# Patient Record
Sex: Female | Born: 1970
Health system: Southern US, Community
[De-identification: ages and names within clinical notes are randomized; demographics above are authoritative.]

## PROBLEM LIST (undated history)

## (undated) DIAGNOSIS — K5090 Crohn's disease, unspecified, without complications: Secondary | ICD-10-CM

## (undated) DIAGNOSIS — D219 Benign neoplasm of connective and other soft tissue, unspecified: Secondary | ICD-10-CM

## (undated) HISTORY — PX: OTHER SURGICAL HISTORY: SHX169

## (undated) HISTORY — DX: Benign neoplasm of connective and other soft tissue, unspecified: D21.9

## (undated) HISTORY — PX: BLADDER SURGERY: SHX569

## (undated) HISTORY — DX: Crohn's disease, unspecified, without complications: K50.90

---

## 2001-07-28 ENCOUNTER — Other Ambulatory Visit: Admission: RE | Admit: 2001-07-28 | Discharge: 2001-07-28 | Payer: Self-pay | Admitting: Gynecology

## 2002-08-11 ENCOUNTER — Other Ambulatory Visit: Admission: RE | Admit: 2002-08-11 | Discharge: 2002-08-11 | Payer: Self-pay | Admitting: Gynecology

## 2003-08-16 ENCOUNTER — Other Ambulatory Visit: Admission: RE | Admit: 2003-08-16 | Discharge: 2003-08-16 | Payer: Self-pay | Admitting: Gynecology

## 2004-06-23 ENCOUNTER — Encounter: Admission: RE | Admit: 2004-06-23 | Discharge: 2004-06-23 | Payer: Self-pay | Admitting: Family Medicine

## 2004-07-04 ENCOUNTER — Encounter: Admission: RE | Admit: 2004-07-04 | Discharge: 2004-07-04 | Payer: Self-pay | Admitting: Gynecology

## 2004-08-21 ENCOUNTER — Other Ambulatory Visit: Admission: RE | Admit: 2004-08-21 | Discharge: 2004-08-21 | Payer: Self-pay | Admitting: Gynecology

## 2005-08-23 ENCOUNTER — Other Ambulatory Visit: Admission: RE | Admit: 2005-08-23 | Discharge: 2005-08-23 | Payer: Self-pay | Admitting: Gynecology

## 2006-08-27 ENCOUNTER — Other Ambulatory Visit: Admission: RE | Admit: 2006-08-27 | Discharge: 2006-08-27 | Payer: Self-pay | Admitting: Gynecology

## 2007-09-01 ENCOUNTER — Other Ambulatory Visit: Admission: RE | Admit: 2007-09-01 | Discharge: 2007-09-01 | Payer: Self-pay | Admitting: Gynecology

## 2009-08-09 ENCOUNTER — Encounter: Admission: RE | Admit: 2009-08-09 | Discharge: 2009-08-09 | Payer: Self-pay | Admitting: Gynecology

## 2010-08-10 ENCOUNTER — Encounter: Admission: RE | Admit: 2010-08-10 | Discharge: 2010-08-10 | Payer: Self-pay | Admitting: Gynecology

## 2011-07-10 ENCOUNTER — Other Ambulatory Visit: Payer: Self-pay | Admitting: Gynecology

## 2011-07-10 DIAGNOSIS — Z1231 Encounter for screening mammogram for malignant neoplasm of breast: Secondary | ICD-10-CM

## 2011-08-13 ENCOUNTER — Ambulatory Visit
Admission: RE | Admit: 2011-08-13 | Discharge: 2011-08-13 | Disposition: A | Discharge status: Home / Self Care | Payer: BC Managed Care – PPO | Source: Ambulatory Visit | Attending: Gynecology | Admitting: Gynecology

## 2011-08-13 DIAGNOSIS — Z1231 Encounter for screening mammogram for malignant neoplasm of breast: Secondary | ICD-10-CM

## 2012-07-17 ENCOUNTER — Other Ambulatory Visit: Payer: Self-pay | Admitting: Gynecology

## 2012-07-17 DIAGNOSIS — Z1231 Encounter for screening mammogram for malignant neoplasm of breast: Secondary | ICD-10-CM

## 2012-08-13 ENCOUNTER — Ambulatory Visit
Admission: RE | Admit: 2012-08-13 | Discharge: 2012-08-13 | Disposition: A | Discharge status: Home / Self Care | Payer: BC Managed Care – PPO | Source: Ambulatory Visit | Attending: Gynecology | Admitting: Gynecology

## 2012-08-13 DIAGNOSIS — Z1231 Encounter for screening mammogram for malignant neoplasm of breast: Secondary | ICD-10-CM

## 2013-09-11 ENCOUNTER — Encounter (HOSPITAL_COMMUNITY): Payer: Self-pay | Admitting: Emergency Medicine

## 2013-09-11 ENCOUNTER — Emergency Department (HOSPITAL_COMMUNITY): Payer: BC Managed Care – PPO

## 2013-09-11 ENCOUNTER — Emergency Department (HOSPITAL_COMMUNITY)
Admission: EM | Admit: 2013-09-11 | Discharge: 2013-09-11 | Disposition: A | Discharge status: Home / Self Care | Payer: BC Managed Care – PPO | Attending: Emergency Medicine | Admitting: Emergency Medicine

## 2013-09-11 DIAGNOSIS — M79672 Pain in left foot: Secondary | ICD-10-CM

## 2013-09-11 DIAGNOSIS — M79609 Pain in unspecified limb: Secondary | ICD-10-CM | POA: Insufficient documentation

## 2013-09-11 MED ORDER — IBUPROFEN 800 MG PO TABS: 800.0000 mg | ORAL_TABLET | Freq: Three times a day (TID) | ORAL | Status: DC | PRN

## 2013-09-11 NOTE — ED Provider Notes (Signed)
CSN: [id number]     Arrival date & time 09/11/13  2458 History   First MD Initiated Contact with Patient 09/11/13 0848     Chief Complaint  Patient presents with  . Foot Pain   (Consider location/radiation/quality/duration/timing/severity/associated sxs/prior Treatment) The history is provided by the patient.   Patient p/w left dorsal foot pain that began several days ago after walking around the parking lot in flip flops.  Pt states she usually walks a lot but not in flip flops.  She is from [place] and is concerned she may have a stress fracture, wanted to get checked before she goes back home.  Pain is intermittent, exacerbated by walking and palpation.  Denies fevers, chills, leg swelling, CP, SOB.  Denies any trauma to the foot or any bone abnormalities.    History reviewed. No pertinent past medical history. Past Surgical History  Procedure Laterality Date  . Bladder surgery     Family History  Problem Relation Age of Onset  . Diabetes Mother   . Hypertension Mother   . Cancer Father   . Multiple sclerosis Father   . Hypertension Father    History  Substance Use Topics  . Smoking status: Never Smoker   . Smokeless tobacco: Never Used  . Alcohol Use: Yes     Comment: wine 2 glasses/week   OB History   Grav Para Term Preterm Abortions TAB SAB Ect Mult Living                 Review of Systems  Constitutional: Negative for fever and chills.  Respiratory: Negative for shortness of breath.   Cardiovascular: Negative for chest pain and leg swelling.  Skin: Negative for color change, pallor and wound.  Neurological: Negative for weakness and numbness.    Allergies  Review of patient's allergies indicates not on file.  Home Medications  No current outpatient prescriptions on file. BP 126/83  Pulse 97  Temp(Src) 98.3 F (36.8 C) (Oral)  Resp 16  Ht 5' 5.5" (1.664 m)  Wt 138 lb (62.596 kg)  BMI 22.61 kg/m2  SpO2 98%  LMP 09/11/2013 Physical Exam  Nursing note  and vitals reviewed. Constitutional: She appears well-developed and well-nourished. No distress.  HENT:  Head: Normocephalic and atraumatic.  Neck: Neck supple.  Pulmonary/Chest: Effort normal.  Musculoskeletal:       Feet:  Left foot:  Full AROM of all toes and of left ankle.  Capillary refill < 2 seconds, sensation intact.  No skin lesions or discoloration.    Neurological: She is alert.  Skin: She is not diaphoretic.    ED Course  Procedures (including critical care time) Labs Review Labs Reviewed - No data to display Imaging Review Dg Foot Complete Left  09/11/2013   CLINICAL DATA:  Left foot pain.  EXAM: LEFT FOOT - COMPLETE 3+ VIEW  COMPARISON:  None.  FINDINGS: There is no evidence of fracture or dislocation. There is no evidence of arthropathy or other focal bone abnormality. Soft tissues are unremarkable.  IMPRESSION: Negative.   Electronically Signed   By: Dereck Ligas M.D.   On: 09/11/2013 09:39    EKG Interpretation   None       MDM   1. Left foot pain      Pt with injury to left foot after walking for a prolonged period on asphalt in flip flops.  Likely strained/sprained.  Pt concerned about stress fracture.  Xray negative.  Discussed result, findings, treatment, and follow up  with patient.  Pt given return precautions.  Pt verbalizes understanding and agrees with plan.        [place], PA-C 09/11/13 [phone]

## 2013-09-11 NOTE — ED Notes (Signed)
Pt ambulated back to room from xray.

## 2013-09-11 NOTE — ED Notes (Signed)
Patient reports that she began having left foot pain after walking on Tuesday. Patient normally walks an hour daily. Left foot slightly bruised and patient states she had slight swelling that occurs at night.

## 2013-09-12 NOTE — ED Provider Notes (Signed)
Medical screening examination/treatment/procedure(s) were performed by non-physician practitioner and as supervising physician I was immediately available for consultation/collaboration.   Houston Siren, MD 09/12/13 [phone]

## 2014-02-12 IMAGING — CR DG FOOT COMPLETE 3+V*L*
3 series · 3 of 3 positions shown · non-contrast
Comparison: None.

CLINICAL DATA: Left foot pain.

EXAM:
LEFT FOOT - COMPLETE 3+ VIEW

[x foot ap left]
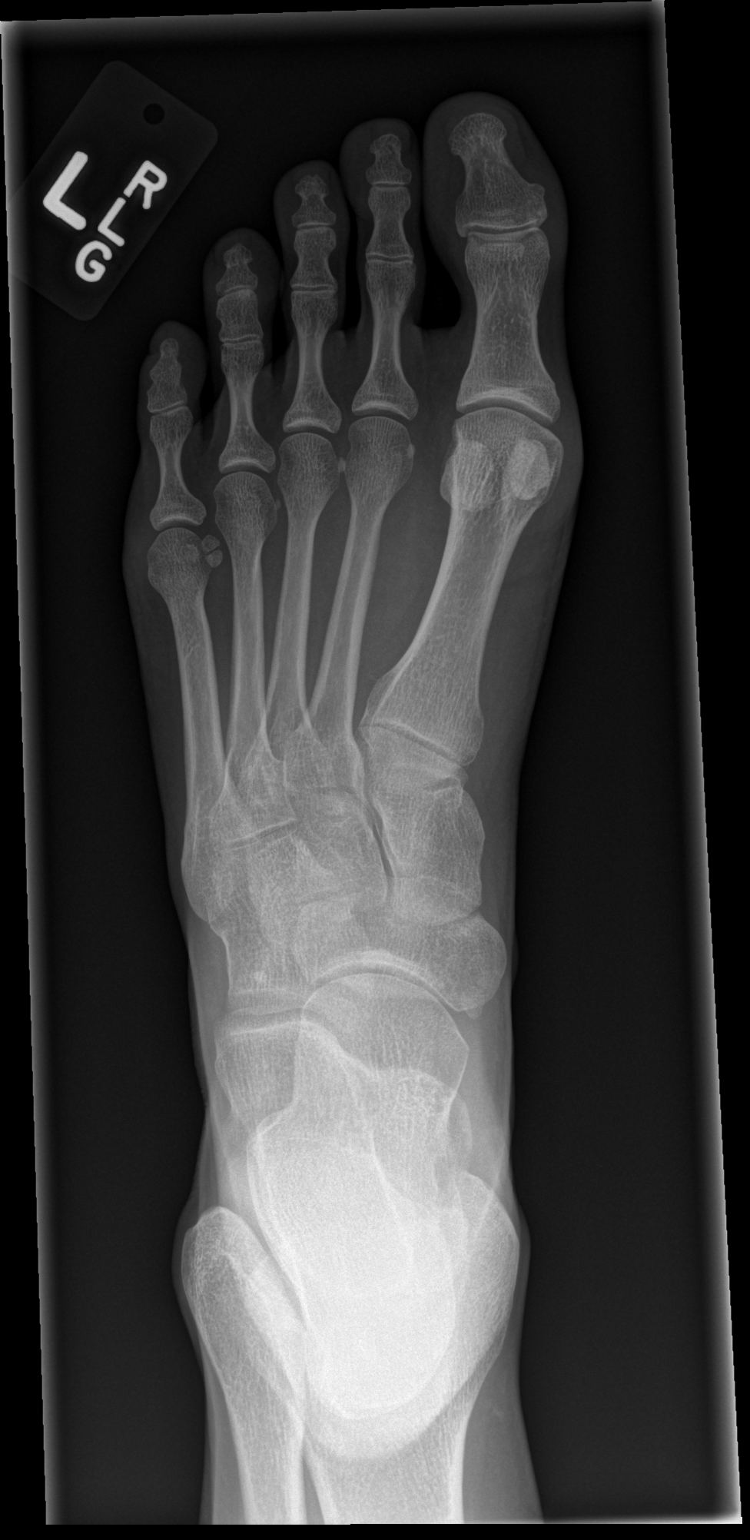

[x foot obl left]
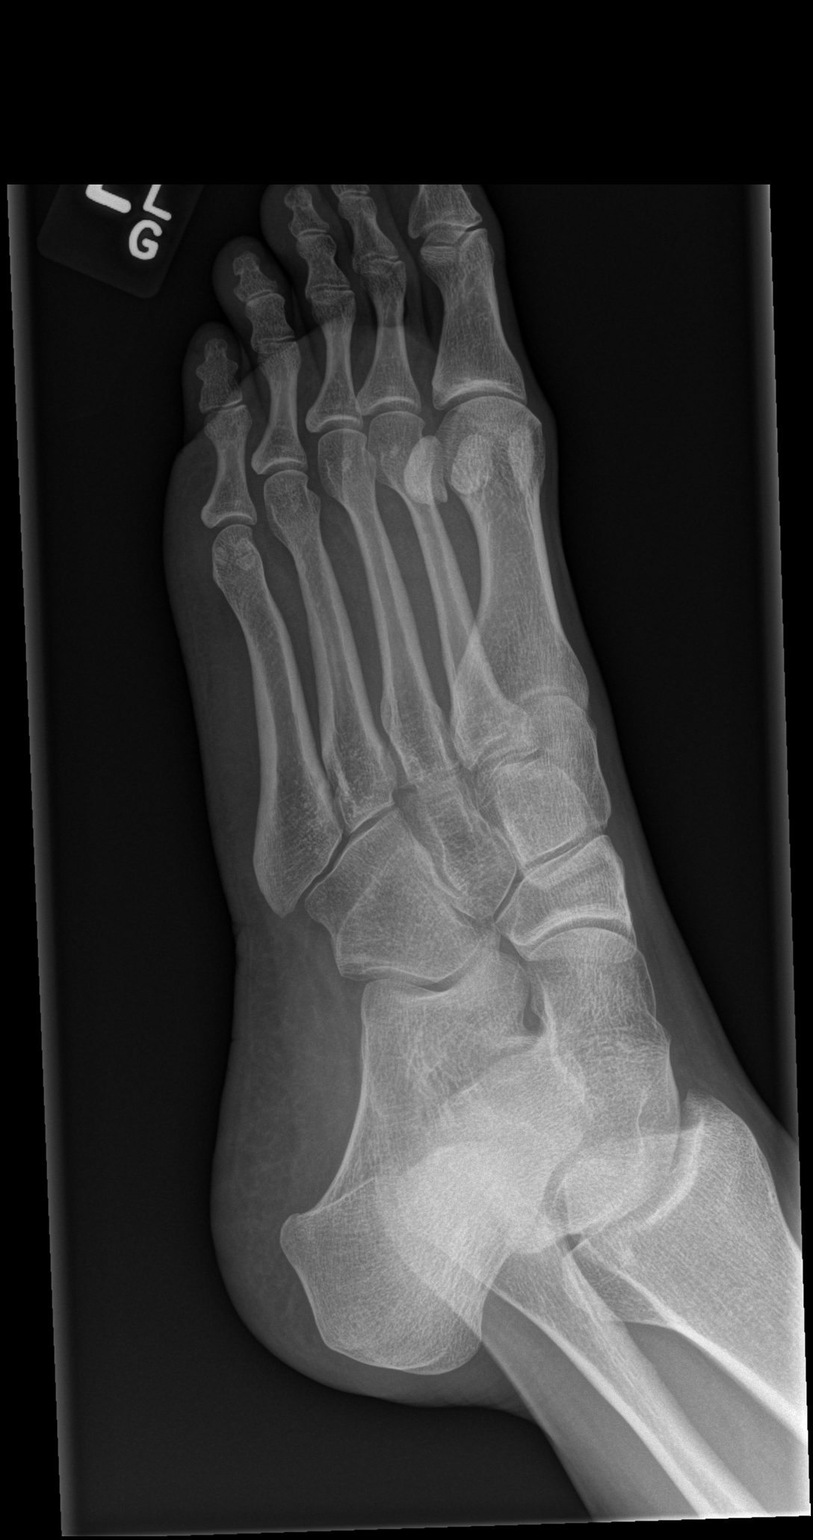

[x foot lat left]
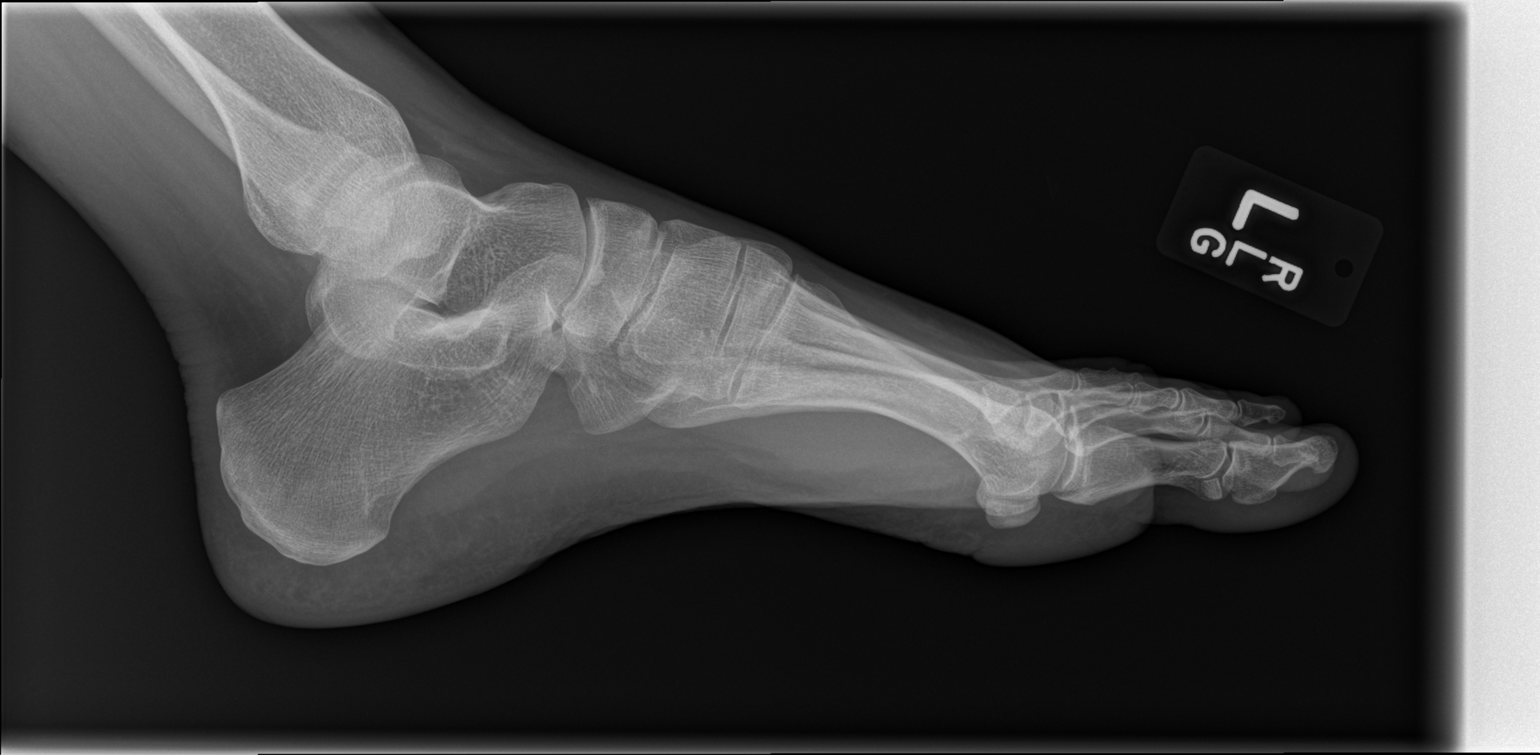

[3 of 3 positions shown; findings below may reference images not displayed]

FINDINGS: There is no evidence of fracture or dislocation. There is no
evidence of arthropathy or other focal bone abnormality. Soft
tissues are unremarkable.
IMPRESSION: Negative.

## 2019-12-04 DIAGNOSIS — Z1211 Encounter for screening for malignant neoplasm of colon: Secondary | ICD-10-CM

## 2019-12-04 HISTORY — DX: Encounter for screening for malignant neoplasm of colon: Z12.11

## 2019-12-08 ENCOUNTER — Other Ambulatory Visit: Payer: Self-pay

## 2019-12-08 ENCOUNTER — Encounter: Payer: Self-pay | Admitting: Obstetrics and Gynecology

## 2019-12-08 ENCOUNTER — Ambulatory Visit (INDEPENDENT_AMBULATORY_CARE_PROVIDER_SITE_OTHER): Payer: BC Managed Care – PPO | Admitting: Obstetrics and Gynecology

## 2019-12-08 VITALS — BP 130/80 | Ht 65.5 in | Wt 147.0 lb

## 2019-12-08 DIAGNOSIS — Z3041 Encounter for surveillance of contraceptive pills: Secondary | ICD-10-CM

## 2019-12-08 DIAGNOSIS — Z13220 Encounter for screening for lipoid disorders: Secondary | ICD-10-CM

## 2019-12-08 DIAGNOSIS — Z0000 Encounter for general adult medical examination without abnormal findings: Secondary | ICD-10-CM

## 2019-12-08 DIAGNOSIS — Z01419 Encounter for gynecological examination (general) (routine) without abnormal findings: Secondary | ICD-10-CM | POA: Diagnosis not present

## 2019-12-08 DIAGNOSIS — Z1211 Encounter for screening for malignant neoplasm of colon: Secondary | ICD-10-CM

## 2019-12-08 DIAGNOSIS — Z1231 Encounter for screening mammogram for malignant neoplasm of breast: Secondary | ICD-10-CM

## 2019-12-08 MED ORDER — NORETHIN ACE-ETH ESTRAD-FE 1-20 MG-MCG PO TABS: 1.0000 | ORAL_TABLET | Freq: Every day | ORAL | 3 refills | Status: DC

## 2019-12-08 NOTE — Patient Instructions (Signed)
I value your feedback and entrusting us with your care. If you get a [facility] patient survey, I would appreciate you taking the time to let us know about your experience today. Thank you!  As of November 12, 2019, your lab results will be released to your MyChart immediately, before I even have a chance to see them. Please give me time to review them and contact you if there are any abnormalities. Thank you for your patience.   [facility]: [phone]  [facility]: [phone]  

## 2019-12-08 NOTE — Progress Notes (Addendum)
PCP: Patient, No Pcp Per   Chief Complaint  Patient presents with  . Gynecologic Exam    HPI:      [name] is a 49 y.o. No obstetric history on file. who LMP was No LMP recorded. (Menstrual status: Oral contraceptives)., presents today for her NP annual examination.  Her menses are regular every 28-30 days, lasting 2 days, spotting only, on OCPs for hx of fibroid.  Dysmenorrhea none. She does not have intermenstrual bleeding. She does not have vasomotor sx.   Sex activity: single partner, contraception - vasectomy. She does have vaginal dryness, relieved with lubricants.  Last Pap: November 03, 2018  Results were: no abnormalities /neg HPV DNA. No hx of abn paps.  Last mammogram: December 05, 2018  Results were: normal--routine follow-up in 12 months There is no FH of breast cancer. There is no FH of ovarian cancer. The patient does do self-breast exams.  Colonoscopy: never, would like cologuard.    Tobacco use: The patient denies current or previous tobacco use. Alcohol use: glass of wine with dinner  No drug use Exercise: moderately active  She does get adequate calcium and Vitamin D in her diet.  No recent labs.  Past Medical History:  Diagnosis Date  . Crohn disease ([place])    59 yr  . Fibroid     Past Surgical History:  Procedure Laterality Date  . BLADDER SURGERY      Family History  Problem Relation Age of Onset  . Hypertension Mother   . Multiple sclerosis Father   . Hypertension Father   . Lung cancer Father   . Breast cancer Other   . Cervical cancer Maternal Aunt        40s  . Ovarian cancer Neg Hx     Social History   Socioeconomic History  . Marital status: Married    Spouse name: Not on file  . Number of children: Not on file  . Years of education: Not on file  . Highest education level: Not on file  Occupational History  . Not on file  Tobacco Use  . Smoking status: Never Smoker  . Smokeless tobacco: Never Used  Substance  and Sexual Activity  . Alcohol use: Yes    Comment: wine 2 glasses/week  . Drug use: No  . Sexual activity: Yes    Birth control/protection: Pill, Surgical    Comment: Vasectomy  Other Topics Concern  . Not on file  Social History Narrative  . Not on file   Social Determinants of Health   Financial Resource Strain:   . Difficulty of Paying Living Expenses: Not on file  Food Insecurity:   . Worried About Charity fundraiser in the Last Year: Not on file  . Ran Out of Food in the Last Year: Not on file  Transportation Needs:   . Lack of Transportation (Medical): Not on file  . Lack of Transportation (Non-Medical): Not on file  Physical Activity:   . Days of Exercise per Week: Not on file  . Minutes of Exercise per Session: Not on file  Stress:   . Feeling of Stress : Not on file  Social Connections:   . Frequency of Communication with Friends and Family: Not on file  . Frequency of Social Gatherings with Friends and Family: Not on file  . Attends Religious Services: Not on file  . Active Member of Clubs or Organizations: Not on file  . Attends Archivist Meetings:  Not on file  . Marital Status: Not on file  Intimate Partner Violence:   . Fear of Current or Ex-Partner: Not on file  . Emotionally Abused: Not on file  . Physically Abused: Not on file  . Sexually Abused: Not on file     Current Outpatient Medications:  .  cholecalciferol (VITAMIN D) 1000 UNITS tablet, Take 1,000 Units by mouth daily., Disp: , Rfl:  .  norethindrone-ethinyl estradiol (BLISOVI FE 1/20) 1-20 MG-MCG tablet, Take 1 tablet by mouth daily., Disp: 3 Package, Rfl: 3 .  vitamin B-12 (CYANOCOBALAMIN) 1000 MCG tablet, Take 1,000 mcg by mouth daily., Disp: , Rfl:      ROS:  Review of Systems  Constitutional: Negative for fatigue, fever and unexpected weight change.  Respiratory: Negative for cough, shortness of breath and wheezing.   Cardiovascular: Negative for chest pain, palpitations  and leg swelling.  Gastrointestinal: Negative for blood in stool, constipation, diarrhea, nausea and vomiting.  Endocrine: Negative for cold intolerance, heat intolerance and polyuria.  Genitourinary: Negative for dyspareunia, dysuria, flank pain, frequency, genital sores, hematuria, menstrual problem, pelvic pain, urgency, vaginal bleeding, vaginal discharge and vaginal pain.  Musculoskeletal: Negative for back pain, joint swelling and myalgias.  Skin: Negative for rash.  Neurological: Negative for dizziness, syncope, light-headedness, numbness and headaches.  Hematological: Negative for adenopathy.  Psychiatric/Behavioral: Negative for agitation, confusion, sleep disturbance and suicidal ideas. The patient is not nervous/anxious.    BREAST: No symptoms    Objective: BP 130/80   Ht 5' 5.5" (1.664 m)   Wt 147 lb (66.7 kg)   BMI 24.09 kg/m    Physical Exam Constitutional:      Appearance: She is well-developed.  Genitourinary:     Vulva, vagina, cervix, uterus, right adnexa and left adnexa normal.     No vulval lesion or tenderness noted.     No vaginal discharge, erythema or tenderness.     No cervical polyp.     Uterus is not enlarged or tender.     No right or left adnexal mass present.     Right adnexa not tender.     Left adnexa not tender.  Neck:     Thyroid: No thyromegaly.  Cardiovascular:     Rate and Rhythm: Normal rate and regular rhythm.     Heart sounds: Normal heart sounds. No murmur.  Pulmonary:     Effort: Pulmonary effort is normal.     Breath sounds: Normal breath sounds.  Chest:     Breasts:        Right: No mass, nipple discharge, skin change or tenderness.        Left: No mass, nipple discharge, skin change or tenderness.  Abdominal:     Palpations: Abdomen is soft.     Tenderness: There is no abdominal tenderness. There is no guarding.  Musculoskeletal:        General: Normal range of motion.     Cervical back: Normal range of motion.   Neurological:     General: No focal deficit present.     Mental Status: She is alert and oriented to person, place, and time.     Cranial Nerves: No cranial nerve deficit.  Skin:    General: Skin is warm and dry.  Psychiatric:        Mood and Affect: Mood normal.        Behavior: Behavior normal.        Thought Content: Thought content normal.  Judgment: Judgment normal.  Vitals reviewed.     Assessment/Plan:  Encounter for annual routine gynecological examination  Encounter for surveillance of contraceptive pills - Plan: norethindrone-ethinyl estradiol (BLISOVI FE 1/20) 1-20 MG-MCG tablet; Discussed stopping OCPs vs taking 1 another yr. Pt would like to continue for this yr. Rx RF. Re-eval next yr.  Encounter for screening mammogram for malignant neoplasm of breast - Plan: 3D MAMMOGRAM SCREENING BILATERAL; pt to sched mammo  Screening for colon cancer - Plan: Cologuard; ref sent  Blood tests for routine general physical examination - Plan: Comprehensive metabolic panel, Lipid panel  Screening cholesterol level - Plan: Lipid panel   Meds ordered this encounter  Medications  . norethindrone-ethinyl estradiol (BLISOVI FE 1/20) 1-20 MG-MCG tablet    Sig: Take 1 tablet by mouth daily.    Dispense:  3 Package    Refill:  3    Order Specific Question:   Supervising Provider    Answer:   Gae Dry [[id number]]            GYN counsel breast self exam, mammography screening, menopause, adequate intake of calcium and vitamin D, diet and exercise    F/U  Return in about 1 year (around 12/07/2020).  Leyanna Bittman B. Marisha Renier, PA-C 12/08/2019 8:46 AM

## 2019-12-09 LAB — COMPREHENSIVE METABOLIC PANEL
ALT: 13 IU/L (ref 0–32)
AST: 18 IU/L (ref 0–40)
Albumin/Globulin Ratio: 2 (ref 1.2–2.2)
Albumin: 4.1 g/dL (ref 3.8–4.8)
Alkaline Phosphatase: 48 IU/L (ref 39–117)
BUN/Creatinine Ratio: 15 (ref 9–23)
BUN: 12 mg/dL (ref 6–24)
Bilirubin Total: 0.3 mg/dL (ref 0.0–1.2)
CO2: 21 mmol/L (ref 20–29)
Calcium: 9.2 mg/dL (ref 8.7–10.2)
Chloride: 104 mmol/L (ref 96–106)
Creatinine, Ser: 0.82 mg/dL (ref 0.57–1.00)
GFR calc Af Amer: 96 mL/min/{1.73_m2} (ref >59)
GFR calc non Af Amer: 84 mL/min/{1.73_m2} (ref >59)
Globulin, Total: 2.1 g/dL (ref 1.5–4.5)
Glucose: 75 mg/dL (ref 65–99)
Potassium: 4.1 mmol/L (ref 3.5–5.2)
Sodium: 139 mmol/L (ref 134–144)
Total Protein: 6.2 g/dL (ref 6.0–8.5)

## 2019-12-09 LAB — LIPID PANEL
Chol/HDL Ratio: 3.9 ratio (ref 0.0–4.4)
Cholesterol, Total: 214 mg/dL — ABNORMAL HIGH (ref 100–199)
HDL: 55 mg/dL (ref >39)
LDL Chol Calc (NIH): 127 mg/dL — ABNORMAL HIGH (ref 0–99)
Triglycerides: 183 mg/dL — ABNORMAL HIGH (ref 0–149)
VLDL Cholesterol Cal: 32 mg/dL (ref 5–40)

## 2019-12-10 NOTE — Progress Notes (Signed)
Pls give pt lab results. Her lipids are borderline. Need to decrease starchy carbs like bread/potatoes/crackers/chips/desserts, and increase fresh fruits/vegetables, lean meats. Also minimize saturated fats in fried foods, red meats, whole fat dairy products. Recheck in 1 yr.  Thx.

## 2019-12-14 ENCOUNTER — Telehealth: Payer: Self-pay | Admitting: Obstetrics and Gynecology

## 2019-12-14 NOTE — Telephone Encounter (Signed)
ERROR: Did not try to call pt yet to record "UTR, VM not set up".  Msg sent to CMA to call pt with lab results, info documented in results. Thx.

## 2019-12-14 NOTE — Telephone Encounter (Signed)
UTR, VM not set up

## 2019-12-14 NOTE — Telephone Encounter (Signed)
Patient is calling for labs results. Please advise. 

## 2019-12-15 NOTE — Telephone Encounter (Signed)
Pt aware.

## 2019-12-23 LAB — COLOGUARD

## 2019-12-24 DIAGNOSIS — C4491 Basal cell carcinoma of skin, unspecified: Secondary | ICD-10-CM

## 2019-12-24 DIAGNOSIS — C4492 Squamous cell carcinoma of skin, unspecified: Secondary | ICD-10-CM

## 2019-12-24 HISTORY — DX: Basal cell carcinoma of skin, unspecified: C44.91

## 2019-12-24 HISTORY — DX: Squamous cell carcinoma of skin, unspecified: C44.92

## 2019-12-29 ENCOUNTER — Encounter: Payer: Self-pay | Admitting: Obstetrics and Gynecology

## 2019-12-29 ENCOUNTER — Telehealth: Payer: Self-pay

## 2019-12-29 NOTE — Telephone Encounter (Signed)
NEGATIVE Cologuard Test, good for the next 3 yrs, pt aware.

## 2020-02-18 ENCOUNTER — Telehealth: Payer: Self-pay | Admitting: Obstetrics and Gynecology

## 2020-02-18 NOTE — Telephone Encounter (Signed)
Pt had f/u mammo and u/s RT breast at [facility] yesterday for abn screening mammo from 1/21. Birads 3 due to dense tissue. Recommended repeat mammo in 6 months. Pt plans to sched. F/u prn.

## 2020-07-07 ENCOUNTER — Encounter: Payer: Self-pay | Admitting: Dermatology

## 2020-08-17 ENCOUNTER — Encounter: Payer: Self-pay | Admitting: Obstetrics and Gynecology

## 2020-09-29 ENCOUNTER — Ambulatory Visit (INDEPENDENT_AMBULATORY_CARE_PROVIDER_SITE_OTHER): Payer: BC Managed Care – PPO | Admitting: Dermatology

## 2020-09-29 ENCOUNTER — Other Ambulatory Visit: Payer: Self-pay

## 2020-09-29 ENCOUNTER — Encounter: Payer: Self-pay | Admitting: Dermatology

## 2020-09-29 DIAGNOSIS — L821 Other seborrheic keratosis: Secondary | ICD-10-CM

## 2020-09-29 DIAGNOSIS — D229 Melanocytic nevi, unspecified: Secondary | ICD-10-CM | POA: Diagnosis not present

## 2020-09-29 DIAGNOSIS — L814 Other melanin hyperpigmentation: Secondary | ICD-10-CM

## 2020-09-29 DIAGNOSIS — Z85828 Personal history of other malignant neoplasm of skin: Secondary | ICD-10-CM

## 2020-09-29 DIAGNOSIS — Z1283 Encounter for screening for malignant neoplasm of skin: Secondary | ICD-10-CM | POA: Diagnosis not present

## 2020-09-29 DIAGNOSIS — D1800 Hemangioma unspecified site: Secondary | ICD-10-CM

## 2020-09-29 DIAGNOSIS — L578 Other skin changes due to chronic exposure to nonionizing radiation: Secondary | ICD-10-CM

## 2020-09-29 NOTE — Progress Notes (Signed)
° °  Follow-Up Visit   Subjective  Jane Wells is a 49 y.o. female who presents for the following: FBSE (Patient here today for full body scan. She is not aware of anything new or changing. She does have a history of BCC and SCCis. ).  Patient here for full body skin exam and skin cancer screening.  The following portions of the chart were reviewed this encounter and updated as appropriate:  Tobacco   Allergies   Meds   Problems   Med Hx   Surg Hx   Fam Hx       Review of Systems:  No other skin or systemic complaints except as noted in HPI or Assessment and Plan.  Objective  Well appearing patient in no apparent distress; mood and affect are within normal limits.  A full examination was performed including scalp, head, eyes, ears, nose, lips, neck, chest, axillae, abdomen, back, buttocks, bilateral upper extremities, bilateral lower extremities, hands, feet, fingers, toes, fingernails, and toenails. All findings within normal limits unless otherwise noted below.    Assessment & Plan    Lentigines - Scattered tan macules - Discussed due to sun exposure - Benign, observe - Call for any changes  Seborrheic Keratoses - Stuck-on, waxy, tan-brown papules and plaques  - Discussed benign etiology and prognosis. - Observe - Call for any changes  Melanocytic Nevi - Tan-brown and/or pink-flesh-colored symmetric macules and papules - Benign appearing on exam today - Observation - Call clinic for new or changing moles - Recommend daily use of broad spectrum spf 30+ sunscreen to sun-exposed areas.   Hemangiomas - Red papules - Discussed benign nature - Observe - Call for any changes  Actinic Damage - diffuse scaly erythematous macules with underlying dyspigmentation - Recommend daily broad spectrum sunscreen SPF 30+ to sun-exposed areas, reapply every 2 hours as needed.  - Call for new or changing lesions.  Skin cancer screening performed today.  History of Basal Cell  Carcinoma of the Skin - No evidence of recurrence today at left calf - Recommend regular full body skin exams - Recommend daily broad spectrum sunscreen SPF 30+ to sun-exposed areas, reapply every 2 hours as needed.  - Call if any new or changing lesions are noted between office visits  History of Squamous Cell Carcinoma in Situ of the Skin - No evidence of recurrence today at left distal shin - Recommend regular full body skin exams - Recommend daily broad spectrum sunscreen SPF 30+ to sun-exposed areas, reapply every 2 hours as needed.  - Call if any new or changing lesions are noted between office visits  Return in about 6 months (around 03/30/2021) for TBSE.  Graciella Belton, RMA, am acting as scribe for Forest Gleason, MD .  Documentation: I have reviewed the above documentation for accuracy and completeness, and I agree with the above.  Forest Gleason, MD

## 2020-09-29 NOTE — Patient Instructions (Signed)

## 2020-11-16 ENCOUNTER — Other Ambulatory Visit: Payer: Self-pay | Admitting: Obstetrics and Gynecology

## 2020-11-16 DIAGNOSIS — Z3041 Encounter for surveillance of contraceptive pills: Secondary | ICD-10-CM

## 2020-12-13 ENCOUNTER — Ambulatory Visit: Payer: BC Managed Care – PPO | Admitting: Obstetrics and Gynecology

## 2020-12-28 ENCOUNTER — Encounter: Payer: Self-pay | Admitting: Dermatology

## 2021-01-18 NOTE — Patient Instructions (Addendum)
I value your feedback and you entrusting us with your care. If you get a [facility] patient survey, I would appreciate you taking the time to let us know about your experience today. Thank you!   [facility]: [phone]  

## 2021-01-18 NOTE — Progress Notes (Signed)
PCP: Patient, No Pcp Per   Chief Complaint  Patient presents with  . Gynecologic Exam    No concerns    HPI:      [name] is a 50 y.o. No obstetric history on file. who LMP was Patient's last menstrual period was 01/09/2021 (exact date)., presents today for her annual examination.  Her menses are regular every 28-30 days, lasting 4 days, spotting only, on OCPs for hx of fibroid.  Dysmenorrhea mild. She does not have intermenstrual bleeding. Didn't have bleeding in past with sprintec per pt but does with junel 1/20 (not sure when change done since I first saw pt 1/21). May want to change back to previous pills.   Sex activity: single partner, contraception - vasectomy. She does have vaginal dryness, relieved with lubricants.  Last Pap: November 03, 2018  Results were: no abnormalities /neg HPV DNA. No hx of abn paps.  Last mammogram: 02/17/20 at [facility]; Results were: cat 3 RT breast, f/u mammo due 6 months. 08/17/20 stable RT breast. Annual mammo due 3/22.  There is no FH of breast cancer. There is no FH of ovarian cancer. The patient does do self-breast exams.  Colonoscopy: never, NEG cologuard 1/21; repeat due after 3 yrs.    Tobacco use: The patient denies current or previous tobacco use. Alcohol use: glass of wine with dinner  No drug use Exercise: very active  She does get adequate calcium and Vitamin D in her diet.  Had borderline lipids 1/21, due for lab recheck today.  Past Medical History:  Diagnosis Date  . Basal cell carcinoma 12/24/2019   Left calf. Superficial.   . Crohn disease ([place])    16 yr  . Fibroid   . Screening for colon cancer 12/2019   Cologuard neg; repeat after 3 yrs  . Squamous cell carcinoma of skin 12/24/2019   Left distal shin. Pigmented. SCCIs    Past Surgical History:  Procedure Laterality Date  . BLADDER SURGERY      Family History  Problem Relation Age of Onset  . Hypertension Mother   . Diabetes Mother   . Cervical cancer  Mother   . Multiple sclerosis Father   . Hypertension Father   . Lung cancer Father   . Breast cancer Other   . Cervical cancer Maternal Aunt        40s  . Cervical cancer Paternal Grandmother   . Ovarian cancer Neg Hx     Social History   Socioeconomic History  . Marital status: Married    Spouse name: Not on file  . Number of children: Not on file  . Years of education: Not on file  . Highest education level: Not on file  Occupational History  . Not on file  Tobacco Use  . Smoking status: Never Smoker  . Smokeless tobacco: Never Used  Vaping Use  . Vaping Use: Never used  Substance and Sexual Activity  . Alcohol use: Yes    Comment: wine 2 glasses/week  . Drug use: No  . Sexual activity: Yes    Birth control/protection: Pill, Surgical    Comment: Vasectomy  Other Topics Concern  . Not on file  Social History Narrative  . Not on file   Social Determinants of Health   Financial Resource Strain: Not on file  Food Insecurity: Not on file  Transportation Needs: Not on file  Physical Activity: Not on file  Stress: Not on file  Social Connections: Not on file  Intimate Partner Violence: Not on file     Current Outpatient Medications:  .  cetirizine (ZYRTEC) 10 MG chewable tablet, Chew by mouth., Disp: , Rfl:  .  cholecalciferol (VITAMIN D) 1000 UNITS tablet, Take 1,000 Units by mouth daily., Disp: , Rfl:  .  fluticasone (FLONASE) 50 MCG/ACT nasal spray, Place into the nose., Disp: , Rfl:  .  vitamin B-12 (CYANOCOBALAMIN) 1000 MCG tablet, Take 1,000 mcg by mouth daily., Disp: , Rfl:  .  norethindrone-ethinyl estradiol (JUNEL FE 1/20) 1-20 MG-MCG tablet, Take 1 tablet by mouth daily., Disp: 84 tablet, Rfl: 3     ROS:  Review of Systems  Constitutional: Negative for fatigue, fever and unexpected weight change.  Respiratory: Negative for cough, shortness of breath and wheezing.   Cardiovascular: Negative for chest pain, palpitations and leg swelling.   Gastrointestinal: Negative for blood in stool, constipation, diarrhea, nausea and vomiting.  Endocrine: Negative for cold intolerance, heat intolerance and polyuria.  Genitourinary: Negative for dyspareunia, dysuria, flank pain, frequency, genital sores, hematuria, menstrual problem, pelvic pain, urgency, vaginal bleeding, vaginal discharge and vaginal pain.  Musculoskeletal: Negative for back pain, joint swelling and myalgias.  Skin: Negative for rash.  Neurological: Negative for dizziness, syncope, light-headedness, numbness and headaches.  Hematological: Negative for adenopathy.  Psychiatric/Behavioral: Negative for agitation, confusion, sleep disturbance and suicidal ideas. The patient is not nervous/anxious.    BREAST: No symptoms    Objective: BP 100/70   Ht 5' 5.5" (1.664 m)   Wt 145 lb (65.8 kg)   LMP 01/09/2021 (Exact Date)   BMI 23.76 kg/m    Physical Exam Constitutional:      Appearance: She is well-developed.  Genitourinary:     Vulva normal.     Right Labia: No rash, tenderness or lesions.    Left Labia: No tenderness, lesions or rash.    No vaginal discharge, erythema or tenderness.      Right Adnexa: not tender and no mass present.    Left Adnexa: not tender and no mass present.    No cervical friability or polyp.     Uterus is not enlarged or tender.  Breasts:     Right: No mass, nipple discharge, skin change or tenderness.     Left: No mass, nipple discharge, skin change or tenderness.    Neck:     Thyroid: No thyromegaly.  Cardiovascular:     Rate and Rhythm: Normal rate and regular rhythm.     Heart sounds: Normal heart sounds. No murmur heard.   Pulmonary:     Effort: Pulmonary effort is normal.     Breath sounds: Normal breath sounds.  Abdominal:     Palpations: Abdomen is soft.     Tenderness: There is no abdominal tenderness. There is no guarding or rebound.  Musculoskeletal:        General: Normal range of motion.     Cervical back:  Normal range of motion.  Lymphadenopathy:     Cervical: No cervical adenopathy.  Neurological:     General: No focal deficit present.     Mental Status: She is alert and oriented to person, place, and time.     Cranial Nerves: No cranial nerve deficit.  Skin:    General: Skin is warm and dry.  Psychiatric:        Mood and Affect: Mood normal.        Behavior: Behavior normal.        Thought Content: Thought content normal.  Judgment: Judgment normal.  Vitals reviewed.     Assessment/Plan: Encounter for annual routine gynecological examination  Encounter for surveillance of contraceptive pills - Plan: norethindrone-ethinyl estradiol (JUNEL FE 1/20) 1-20 MG-MCG tablet; OCP RF. Still having menses with OCPs. Re-eval next yr.  Encounter for screening mammogram for malignant neoplasm of breast - Plan: MM 3D SCREEN BREAST BILATERAL; pt to sched mammo at [facility]  Elevated cholesterol - Plan: Lipid panel, recheck today.    Meds ordered this encounter  Medications  . norethindrone-ethinyl estradiol (JUNEL FE 1/20) 1-20 MG-MCG tablet    Sig: Take 1 tablet by mouth daily.    Dispense:  84 tablet    Refill:  3            GYN counsel breast self exam, mammography screening, menopause, adequate intake of calcium and vitamin D, diet and exercise    F/U  Return in about 1 year (around 01/19/2022).  Maryln Eastham B. Mallissa Lorenzen, PA-C 01/19/2021 12:04 PM

## 2021-01-19 ENCOUNTER — Other Ambulatory Visit: Payer: Self-pay

## 2021-01-19 ENCOUNTER — Ambulatory Visit (INDEPENDENT_AMBULATORY_CARE_PROVIDER_SITE_OTHER): Payer: BC Managed Care – PPO | Admitting: Obstetrics and Gynecology

## 2021-01-19 ENCOUNTER — Encounter: Payer: Self-pay | Admitting: Obstetrics and Gynecology

## 2021-01-19 VITALS — BP 100/70 | Ht 65.5 in | Wt 145.0 lb

## 2021-01-19 DIAGNOSIS — Z01419 Encounter for gynecological examination (general) (routine) without abnormal findings: Secondary | ICD-10-CM

## 2021-01-19 DIAGNOSIS — Z1231 Encounter for screening mammogram for malignant neoplasm of breast: Secondary | ICD-10-CM

## 2021-01-19 DIAGNOSIS — E7800 Pure hypercholesterolemia, unspecified: Secondary | ICD-10-CM | POA: Diagnosis not present

## 2021-01-19 DIAGNOSIS — Z3041 Encounter for surveillance of contraceptive pills: Secondary | ICD-10-CM | POA: Diagnosis not present

## 2021-01-19 MED ORDER — NORETHIN ACE-ETH ESTRAD-FE 1-20 MG-MCG PO TABS: 1.0000 | ORAL_TABLET | Freq: Every day | ORAL | 3 refills | Status: DC

## 2021-01-20 ENCOUNTER — Encounter: Payer: Self-pay | Admitting: Obstetrics and Gynecology

## 2021-01-20 LAB — LIPID PANEL
Chol/HDL Ratio: 4.2 ratio (ref 0.0–4.4)
Cholesterol, Total: 257 mg/dL — ABNORMAL HIGH (ref 100–199)
HDL: 61 mg/dL (ref >39)
LDL Chol Calc (NIH): 165 mg/dL — ABNORMAL HIGH (ref 0–99)
Triglycerides: 172 mg/dL — ABNORMAL HIGH (ref 0–149)
VLDL Cholesterol Cal: 31 mg/dL (ref 5–40)

## 2021-01-23 NOTE — Addendum Note (Signed)
Addended by: Chad Cordial on: 03/19/4080 08:33 AM   Modules accepted: Orders

## 2021-02-22 ENCOUNTER — Encounter: Payer: Self-pay | Admitting: Obstetrics and Gynecology

## 2021-04-06 ENCOUNTER — Ambulatory Visit (INDEPENDENT_AMBULATORY_CARE_PROVIDER_SITE_OTHER): Payer: BC Managed Care – PPO | Admitting: Dermatology

## 2021-04-06 ENCOUNTER — Encounter: Payer: Self-pay | Admitting: Dermatology

## 2021-04-06 ENCOUNTER — Other Ambulatory Visit: Payer: Self-pay

## 2021-04-06 DIAGNOSIS — Z1283 Encounter for screening for malignant neoplasm of skin: Secondary | ICD-10-CM

## 2021-04-06 DIAGNOSIS — Z85828 Personal history of other malignant neoplasm of skin: Secondary | ICD-10-CM

## 2021-04-06 DIAGNOSIS — L814 Other melanin hyperpigmentation: Secondary | ICD-10-CM

## 2021-04-06 DIAGNOSIS — L821 Other seborrheic keratosis: Secondary | ICD-10-CM

## 2021-04-06 DIAGNOSIS — D1800 Hemangioma unspecified site: Secondary | ICD-10-CM

## 2021-04-06 DIAGNOSIS — R21 Rash and other nonspecific skin eruption: Secondary | ICD-10-CM

## 2021-04-06 DIAGNOSIS — L578 Other skin changes due to chronic exposure to nonionizing radiation: Secondary | ICD-10-CM

## 2021-04-06 DIAGNOSIS — Z86007 Personal history of in-situ neoplasm of skin: Secondary | ICD-10-CM | POA: Diagnosis not present

## 2021-04-06 DIAGNOSIS — D229 Melanocytic nevi, unspecified: Secondary | ICD-10-CM

## 2021-04-06 MED ORDER — TRIAMCINOLONE ACETONIDE 0.1 % EX OINT: 1.0000 "application " | TOPICAL_OINTMENT | Freq: Two times a day (BID) | CUTANEOUS | 1 refills | Status: DC | PRN

## 2021-04-06 MED ORDER — HYDROCORTISONE 2.5 % EX CREA
TOPICAL_CREAM | Freq: Two times a day (BID) | CUTANEOUS | 1 refills | Status: DC | PRN
Start: 2021-04-06 — End: 2023-01-29

## 2021-04-06 NOTE — Patient Instructions (Addendum)
Melanoma ABCDEs  Melanoma is the most dangerous type of skin cancer, and is the leading cause of death from skin disease.  You are more likely to develop melanoma if you:  Have light-colored skin, light-colored eyes, or red or blond hair  Spend a lot of time in the sun  Tan regularly, either outdoors or in a tanning bed  Have had blistering sunburns, especially during childhood  Have a close family member who has had a melanoma  Have atypical moles or large birthmarks  Early detection of melanoma is key since treatment is typically straightforward and cure rates are extremely high if we catch it early.   The first sign of melanoma is often a change in a mole or a new dark spot.  The ABCDE system is a way of remembering the signs of melanoma.  A for asymmetry:  The two halves do not match. B for border:  The edges of the growth are irregular. C for color:  A mixture of colors are present instead of an even brown color. D for diameter:  Melanomas are usually (but not always) greater than 72m - the size of a pencil eraser. E for evolution:  The spot keeps changing in size, shape, and color.  Please check your skin once per month between visits. You can use a small mirror in front and a large mirror behind you to keep an eye on the back side or your body.   If you see any new or changing lesions before your next follow-up, please call to schedule a visit.  Please continue daily skin protection including broad spectrum sunscreen SPF 30+ to sun-exposed areas, reapplying every 2 hours as needed when you're outdoors.   Staying in the shade or wearing long sleeves, sun glasses (UVA+UVB protection) and wide brim hats (4-inch brim around the entire circumference of the hat) are also recommended for sun protection.   Recommend taking Heliocare sun protection supplement daily in sunny weather for additional sun protection. For maximum protection on the sunniest days, you can take up to 2  capsules of regular Heliocare OR take 1 capsule of Heliocare Ultra. For prolonged exposure (such as a full day in the sun), you can repeat your dose of the supplement 4 hours after your first dose. [facility] at [email or web address]   Topical steroids (such as triamcinolone, fluocinolone, fluocinonide, mometasone, clobetasol, halobetasol, betamethasone, hydrocortisone) can cause thinning and lightening of the skin if they are used for too long in the same area. Your physician has selected the right strength medicine for your problem and area affected on the body. Please use your medication only as directed by your physician to prevent side effects.   Avoid applying to face, groin, and axilla. Use as directed. Risk of skin atrophy with long-term use reviewed.   Recommend OTC Gold Bond Rapid Relief Anti-Itch cream (pramoxine + menthol) up to 3 times per day to areas that are itchy.  If you have any questions or concerns for your doctor, please call our main line at [phone] press option 4 to reach your doctor's medical assistant. If no one answers, please leave a voicemail as directed and we will return your call as soon as possible. Messages left after 4 pm will be answered the following business day.   You may also send [place] message via [place] We typically respond to MyChart messages within 1-2 business days.  For prescription refills, please ask your pharmacy  to contact our office. Our fax number is [phone].  If you have an urgent issue when the clinic is closed that cannot wait until the next business day, you can page your doctor at the number below.    Please note that while we do our best to be available for urgent issues outside of office hours, we are not available 24/7.   If you have an urgent issue and are unable to reach [place], you may choose to seek medical care at your doctor's office, retail clinic, urgent care center, or emergency  room.  If you have a medical emergency, please immediately call 911 or go to the emergency department.  Pager Numbers  - Dr. Nehemiah Massed: [phone]  - Dr. Laurence Ferrari: [phone]  - Dr. Nicole Kindred: [phone]  In the event of inclement weather, please call our main line at [phone] for an update on the status of any delays or closures.  Dermatology Medication Tips: Please keep the boxes that topical medications come in in order to help keep track of the instructions about where and how to use these. Pharmacies typically print the medication instructions only on the boxes and not directly on the medication tubes.   If your medication is too expensive, please contact our office at [phone] option 4 or send [place] a message through [place].   We are unable to tell what your co-pay for medications will be in advance as this is different depending on your insurance coverage. However, we may be able to find a substitute medication at lower cost or fill out paperwork to get insurance to cover a needed medication.   If a prior authorization is required to get your medication covered by your insurance company, please allow [place] 1-2 business days to complete this process.  Drug prices often vary depending on where the prescription is filled and some pharmacies may offer cheaper prices.  The website www.goodrx.com contains coupons for medications through different pharmacies. The prices here do not account for what the cost may be with help from insurance (it may be cheaper with your insurance), but the website can give you the price if you did not use any insurance.  - You can print the associated coupon and take it with your prescription to the pharmacy.  - You may also stop by our office during regular business hours and pick up a GoodRx coupon card.  - If you need your prescription sent electronically to a different pharmacy, notify our office through [facility] or by phone at [phone]  option 4.

## 2021-04-06 NOTE — Progress Notes (Signed)
Follow-Up Visit   Subjective  Jane Wells is a 50 y.o. female who presents for the following: Follow-up (Patient here today for tbse. She has history of skin cancer on left calf and lt distal shin. Patient reports left lower leg and  right forearm. ).  Patient states she has been breaking out a few times a year in a itchy rash. She states rash starts on legs, chest, and face. Recurring for 2 years.  Patient states she uses gentle skin care products.  Patient here for full body skin exam and skin cancer screening.  The following portions of the chart were reviewed this encounter and updated as appropriate:  Tobacco  Allergies  Meds  Problems  Med Hx  Surg Hx  Fam Hx      Objective  Well appearing patient in no apparent distress; mood and affect are within normal limits.  A full examination was performed including scalp, head, eyes, ears, nose, lips, neck, chest, axillae, abdomen, back, buttocks, bilateral upper extremities, bilateral lower extremities, hands, feet, fingers, toes, fingernails, and toenails. All findings within normal limits unless otherwise noted below.  Objective  Right Lower Leg - Anterior: Patient's photos showed scattered pink papules   Assessment & Plan  Rash and other nonspecific skin eruption Right Lower Leg - Anterior  Patient has been bothered by recurring rash on legs, chest, and face for about 2 years. Chronic condition, not controlled - recurs and does not have treatment to clear it.   Patient's photos showed scattered pink papules   Recommend seeing when active to consider biopsy  Plan patch testing after her [place] trip  When active rash start hydrocortisone 2.5 % - apply topically 2 times daily as needed to affected areas of face, underarms, or groin area. Can use for up to 2 weeks.   When active rash start triamcinolone ointment 0.1 % - apply 1 topically 2 times daily as needed to affected areas for rash. Use for up to 2 weeks. Avoid  applying to face, groin, and axilla. Use as directed. Risk of skin atrophy with long-term use reviewed.   Topical steroids (such as triamcinolone, fluocinolone, fluocinonide, mometasone, clobetasol, halobetasol, betamethasone, hydrocortisone) can cause thinning and lightening of the skin if they are used for too long in the same area. Your physician has selected the right strength medicine for your problem and area affected on the body. Please use your medication only as directed by your physician to prevent side effects.   Recommend OTC Gold Bond Rapid Relief Anti-Itch cream (pramoxine + menthol) up to 3 times per day to areas that are itchy.   hydrocortisone 2.5 % cream - Right Lower Leg - Anterior  triamcinolone ointment (KENALOG) 0.1 % - Right Lower Leg - Anterior   Lentigines - Scattered tan macules - Due to sun exposure - Benign-appering, observe - Recommend daily broad spectrum sunscreen SPF 30+ to sun-exposed areas, reapply every 2 hours as needed. - Call for any changes  Seborrheic Keratoses - Stuck-on, waxy, tan-brown papules and/or plaques right forearm and left lower leg - Benign-appearing - Discussed benign etiology and prognosis. - Observe - Call for any changes  Melanocytic Nevi - Tan-brown and/or pink-flesh-colored symmetric macules and papules - Benign appearing on exam today - Observation - Call clinic for new or changing moles - Recommend daily use of broad spectrum spf 30+ sunscreen to sun-exposed areas.   Hemangiomas - Red papules - Discussed benign nature - Observe - Call for any changes  Actinic  Damage - Chronic condition, secondary to cumulative UV/sun exposure - diffuse scaly erythematous macules with underlying dyspigmentation - Recommend daily broad spectrum sunscreen SPF 30+ to sun-exposed areas, reapply every 2 hours as needed.  - Staying in the shade or wearing long sleeves, sun glasses (UVA+UVB protection) and wide brim hats (4-inch brim around  the entire circumference of the hat) are also recommended for sun protection.  - Call for new or changing lesions.  History of Basal Cell Carcinoma of the Skin - No evidence of recurrence today left calf (2021) - Recommend regular full body skin exams - Recommend daily broad spectrum sunscreen SPF 30+ to sun-exposed areas, reapply every 2 hours as needed.  - Call if any new or changing lesions are noted between office visits  History of Squamous Cell Carcinoma in Situ of the Skin - No evidence of recurrence today on left distal shin - Recommend regular full body skin exams - Recommend daily broad spectrum sunscreen SPF 30+ to sun-exposed areas, reapply every 2 hours as needed.  - Call if any new or changing lesions are noted between office visits   Skin cancer screening performed today.  Return in about 1-2 months for patch testing after her [place] trip, 6 month tbse .   I, Ruthell Rummage, CMA, am acting as scribe for Forest Gleason, MD.  Documentation: I have reviewed the above documentation for accuracy and completeness, and I agree with the above.  Forest Gleason, MD

## 2021-05-16 ENCOUNTER — Ambulatory Visit: Payer: BC Managed Care – PPO

## 2021-05-18 ENCOUNTER — Ambulatory Visit: Payer: BC Managed Care – PPO

## 2021-05-23 ENCOUNTER — Ambulatory Visit: Payer: BC Managed Care – PPO | Admitting: Dermatology

## 2021-06-19 ENCOUNTER — Ambulatory Visit: Payer: BC Managed Care – PPO

## 2021-06-21 ENCOUNTER — Ambulatory Visit: Payer: BC Managed Care – PPO

## 2021-07-04 ENCOUNTER — Ambulatory Visit: Payer: BC Managed Care – PPO

## 2021-07-06 ENCOUNTER — Ambulatory Visit: Payer: BC Managed Care – PPO

## 2021-07-11 ENCOUNTER — Ambulatory Visit: Payer: BC Managed Care – PPO | Admitting: Dermatology

## 2021-07-12 ENCOUNTER — Other Ambulatory Visit: Payer: Self-pay

## 2021-07-12 ENCOUNTER — Other Ambulatory Visit: Payer: BC Managed Care – PPO

## 2021-07-12 DIAGNOSIS — E7800 Pure hypercholesterolemia, unspecified: Secondary | ICD-10-CM

## 2021-07-17 ENCOUNTER — Telehealth: Payer: Self-pay

## 2021-07-17 ENCOUNTER — Encounter: Payer: Self-pay | Admitting: Obstetrics and Gynecology

## 2021-07-17 LAB — LIPID PANEL
Chol/HDL Ratio: 3.6 ratio (ref 0.0–4.4)
Cholesterol, Total: 200 mg/dL — ABNORMAL HIGH (ref 100–199)
HDL: 56 mg/dL (ref >39)
LDL Chol Calc (NIH): 120 mg/dL — ABNORMAL HIGH (ref 0–99)
Triglycerides: 133 mg/dL (ref 0–149)
VLDL Cholesterol Cal: 24 mg/dL (ref 5–40)

## 2021-07-17 NOTE — Telephone Encounter (Signed)
Pt has seen results.

## 2021-07-17 NOTE — Telephone Encounter (Signed)
Pt calling for lab results; last time they were back in a day; this time it's been a week.  [id number]  Pt aware will have JW investigate.  LM for JW.

## 2021-10-12 ENCOUNTER — Ambulatory Visit (INDEPENDENT_AMBULATORY_CARE_PROVIDER_SITE_OTHER): Payer: BC Managed Care – PPO | Admitting: Dermatology

## 2021-10-12 ENCOUNTER — Other Ambulatory Visit: Payer: Self-pay

## 2021-10-12 DIAGNOSIS — L578 Other skin changes due to chronic exposure to nonionizing radiation: Secondary | ICD-10-CM | POA: Diagnosis not present

## 2021-10-12 DIAGNOSIS — Z1283 Encounter for screening for malignant neoplasm of skin: Secondary | ICD-10-CM | POA: Diagnosis not present

## 2021-10-12 DIAGNOSIS — D229 Melanocytic nevi, unspecified: Secondary | ICD-10-CM

## 2021-10-12 DIAGNOSIS — L821 Other seborrheic keratosis: Secondary | ICD-10-CM

## 2021-10-12 DIAGNOSIS — Z86006 Personal history of melanoma in-situ: Secondary | ICD-10-CM | POA: Diagnosis not present

## 2021-10-12 DIAGNOSIS — D1800 Hemangioma unspecified site: Secondary | ICD-10-CM

## 2021-10-12 DIAGNOSIS — L814 Other melanin hyperpigmentation: Secondary | ICD-10-CM

## 2021-10-12 DIAGNOSIS — Z85828 Personal history of other malignant neoplasm of skin: Secondary | ICD-10-CM | POA: Diagnosis not present

## 2021-10-12 NOTE — Progress Notes (Signed)
   Follow-Up Visit   Subjective  Jane Wells is a 50 y.o. female who presents for the following: FBSE (Patient here for full body skin exam and skin cancer screening. Patient with hx of SCCis, BCC. She is not aware of any new or changing spots today. ).   The following portions of the chart were reviewed this encounter and updated as appropriate:   Tobacco  Allergies  Meds  Problems  Med Hx  Surg Hx  Fam Hx      Review of Systems:  No other skin or systemic complaints except as noted in HPI or Assessment and Plan.  Objective  Well appearing patient in no apparent distress; mood and affect are within normal limits.  A full examination was performed including scalp, head, eyes, ears, nose, lips, neck, chest, axillae, abdomen, back, buttocks, bilateral upper extremities, bilateral lower extremities, hands, feet, fingers, toes, fingernails, and toenails. All findings within normal limits unless otherwise noted below.    Assessment & Plan   Lentigines - Scattered tan macules - Due to sun exposure - Benign-appearing, observe - Recommend daily broad spectrum sunscreen SPF 30+ to sun-exposed areas, reapply every 2 hours as needed. - Call for any changes  Seborrheic Keratoses - Stuck-on, waxy, tan-brown papules and/or plaques  - Benign-appearing - Discussed benign etiology and prognosis. - Observe - Call for any changes  Melanocytic Nevi - Tan-brown and/or pink-flesh-colored symmetric macules and papules - Benign appearing on exam today - Observation - Call clinic for new or changing moles - Recommend daily use of broad spectrum spf 30+ sunscreen to sun-exposed areas.   Hemangiomas - Red papules - Discussed benign nature - Observe - Call for any changes  Actinic Damage - Chronic condition, secondary to cumulative UV/sun exposure - diffuse scaly erythematous macules with underlying dyspigmentation - Recommend daily broad spectrum sunscreen SPF 30+ to sun-exposed  areas, reapply every 2 hours as needed.  - Staying in the shade or wearing long sleeves, sun glasses (UVA+UVB protection) and wide brim hats (4-inch brim around the entire circumference of the hat) are also recommended for sun protection.  - Call for new or changing lesions.  Skin cancer screening performed today.  History of Basal Cell Carcinoma of the Skin - No evidence of recurrence today - Recommend regular full body skin exams - Recommend daily broad spectrum sunscreen SPF 30+ to sun-exposed areas, reapply every 2 hours as needed.  - Call if any new or changing lesions are noted between office visits  History of Squamous Cell Carcinoma in Situ of the Skin - No evidence of recurrence today - Recommend regular full body skin exams - Recommend daily broad spectrum sunscreen SPF 30+ to sun-exposed areas, reapply every 2 hours as needed.  - Call if any new or changing lesions are noted between office visits  Return in about 9 months (around 07/12/2022) for TBSE.  Graciella Belton, RMA, am acting as scribe for Forest Gleason, MD .  Documentation: I have reviewed the above documentation for accuracy and completeness, and I agree with the above.  Forest Gleason, MD

## 2021-10-12 NOTE — Patient Instructions (Addendum)
Recommend taking Heliocare sun protection supplement daily in sunny weather for additional sun protection. For maximum protection on the sunniest days, you can take up to 2 capsules of regular Heliocare OR take 1 capsule of Heliocare Ultra. For prolonged exposure (such as a full day in the sun), you can repeat your dose of the supplement 4 hours after your first dose. Heliocare can be purchased at [facility] or at [email or web address].    Recommend Niacinamide or Nicotinamide 530m twice per day to lower risk of non-melanoma skin cancer by approximately 25%. This is usually available at [employer].    Melanoma ABCDEs  Melanoma is the most dangerous type of skin cancer, and is the leading cause of death from skin disease.  You are more likely to develop melanoma if you: Have light-colored skin, light-colored eyes, or red or blond hair Spend a lot of time in the sun Tan regularly, either outdoors or in a tanning bed Have had blistering sunburns, especially during childhood Have a close family member who has had a melanoma Have atypical moles or large birthmarks  Early detection of melanoma is key since treatment is typically straightforward and cure rates are extremely high if we catch it early.   The first sign of melanoma is often a change in a mole or a new dark spot.  The ABCDE system is a way of remembering the signs of melanoma.  A for asymmetry:  The two halves do not match. B for border:  The edges of the growth are irregular. C for color:  A mixture of colors are present instead of an even brown color. D for diameter:  Melanomas are usually (but not always) greater than 615m- the size of a pencil eraser. E for evolution:  The spot keeps changing in size, shape, and color.  Please check your skin once per month between visits. You can use a small mirror in front and a large mirror behind you to keep an eye on the back side or your body.   If you see any new or changing  lesions before your next follow-up, please call to schedule a visit.  Please continue daily skin protection including broad spectrum sunscreen SPF 30+ to sun-exposed areas, reapplying every 2 hours as needed when you're outdoors.    If you have any questions or concerns for your doctor, please call our main line at [phone] press option 4 to reach your doctor's medical assistant. If no one answers, please leave a voicemail as directed and we will return your call as soon as possible. Messages left after 4 pm will be answered the following business day.   You may also send [place] message via [facility] typically respond to MyChart messages within 1-2 business days.  For prescription refills, please ask your pharmacy to contact our office. Our fax number is [phone] If you have an urgent issue when the clinic is closed that cannot wait until the next business day, you can page your doctor at the number below.    Please note that while we do our best to be available for urgent issues outside of office hours, we are not available 24/7.   If you have an urgent issue and are unable to reach [place] may choose to seek medical care at your doctor's office, retail clinic, urgent care center, or emergency room.  If you have a medical emergency, please immediately call 911 or go to the emergency department.  Pager  Numbers  - Dr. Nehemiah Massed: [phone]  - Dr. Laurence Ferrari: [phone]  - Dr. Nicole Kindred: [phone]  In the event of inclement weather, please call our main line at [phone] for an update on the status of any delays or closures.  Dermatology Medication Tips: Please keep the boxes that topical medications come in in order to help keep track of the instructions about where and how to use these. Pharmacies typically print the medication instructions only on the boxes and not directly on the medication tubes.   If your medication is too expensive, please contact our office at  [phone] option 4 or send [place] a message through [place].   We are unable to tell what your co-pay for medications will be in advance as this is different depending on your insurance coverage. However, we may be able to find a substitute medication at lower cost or fill out paperwork to get insurance to cover a needed medication.   If a prior authorization is required to get your medication covered by your insurance company, please allow [place] 1-2 business days to complete this process.  Drug prices often vary depending on where the prescription is filled and some pharmacies may offer cheaper prices.  The website www.goodrx.com contains coupons for medications through different pharmacies. The prices here do not account for what the cost may be with help from insurance (it may be cheaper with your insurance), but the website can give you the price if you did not use any insurance.  - You can print the associated coupon and take it with your prescription to the pharmacy.  - You may also stop by our office during regular business hours and pick up a GoodRx coupon card.  - If you need your prescription sent electronically to a different pharmacy, notify our office through [facility] or by phone at [phone] option 4.

## 2021-10-17 ENCOUNTER — Encounter: Payer: Self-pay | Admitting: Dermatology

## 2021-12-30 ENCOUNTER — Other Ambulatory Visit: Payer: Self-pay | Admitting: Obstetrics and Gynecology

## 2021-12-30 DIAGNOSIS — Z3041 Encounter for surveillance of contraceptive pills: Secondary | ICD-10-CM

## 2022-01-22 NOTE — Progress Notes (Signed)
PCP: Patient, No Pcp Per (Inactive)   Chief Complaint  Patient presents with   Gynecologic Exam    No concerns    HPI:      [name] is a 51 y.o. No obstetric history on file. who LMP was Patient's last menstrual period was 01/09/2022 (approximate)., presents today for her annual examination.  Her menses are regular every 28-30 days, lasting few days, dark spotting only, no dysmen, on OCPs for hx of fibroid.  LMP had 2 days of real flow.  She does not have intermenstrual bleeding. Didn't have bleeding in past with sprintec per pt but does with junel 1/20 (not sure when change done since I first saw pt 1/21).   Sex activity: single partner, contraception - vasectomy. No pain/bleeding.  Last Pap: November 03, 2018  Results were: no abnormalities /neg HPV DNA. No hx of abn paps.  Last mammogram: 02/22/21 at [facility]; Results were: normal; hx of stable RT breast with dx mammo and /us in past. Due for screening mammo now.  There is no FH of breast cancer. There is no FH of ovarian cancer. The patient does do self-breast exams.  Colonoscopy: never, NEG cologuard 1/21; repeat due after 3 yrs.    Tobacco use: The patient denies current or previous tobacco use. Alcohol use: glass of wine with dinner  No drug use Exercise: very active  She does get adequate calcium and Vitamin D in her diet.  Had borderline lipids 8/22, improved from 2/22, due for lab recheck today.  Pt having trouble sleeping some nights, not improved with OTC sleep meds. Melatonin makes sx worse. Would like Rx to take occas for sleep. Has also been under increased stress.   Past Medical History:  Diagnosis Date   Basal cell carcinoma 12/24/2019   Left calf. Superficial.    Crohn disease ([place])    18 yr   Fibroid    Screening for colon cancer 12/2019   Cologuard neg; repeat after 3 yrs   Squamous cell carcinoma of skin 12/24/2019   Left distal shin. Pigmented. SCCIs    Past Surgical History:  Procedure  Laterality Date   BLADDER SURGERY      Family History  Problem Relation Age of Onset   Hypertension Mother    Diabetes Mother    Cervical cancer Mother    Multiple sclerosis Father    Hypertension Father    Lung cancer Father    Cervical cancer Maternal Aunt        56s   Cervical cancer Paternal Grandmother    Breast cancer Other    Ovarian cancer Neg Hx     Social History   Socioeconomic History   Marital status: Married    Spouse name: Not on file   Number of children: Not on file   Years of education: Not on file   Highest education level: Not on file  Occupational History   Not on file  Tobacco Use   Smoking status: Never   Smokeless tobacco: Never  Vaping Use   Vaping Use: Never used  Substance and Sexual Activity   Alcohol use: Yes    Comment: wine 2 glasses/week   Drug use: No   Sexual activity: Yes    Birth control/protection: Pill, Surgical    Comment: Vasectomy  Other Topics Concern   Not on file  Social History Narrative   Not on file   Social Determinants of Health   Financial Resource Strain: Not on file  Food Insecurity: Not on file  Transportation Needs: Not on file  Physical Activity: Not on file  Stress: Not on file  Social Connections: Not on file  Intimate Partner Violence: Not on file     Current Outpatient Medications:    cetirizine (ZYRTEC) 10 MG chewable tablet, Chew by mouth., Disp: , Rfl:    cholecalciferol (VITAMIN D) 1000 UNITS tablet, Take 1,000 Units by mouth daily., Disp: , Rfl:    fluticasone (FLONASE) 50 MCG/ACT nasal spray, Place into the nose., Disp: , Rfl:    hydrocortisone 2.5 % cream, Apply topically 2 (two) times daily as needed (Rash). to affected areas at face, underarms or groin area. Use for up to 2 weeks., Disp: 28 g, Rfl: 1   JUNEL FE 1/20 1-20 MG-MCG tablet, TAKE 1 TABLET BY MOUTH EVERY DAY, Disp: 84 tablet, Rfl: 0   mupirocin ointment (BACTROBAN) 2 %, SMARTSIG:1 Application Topical 2-3 Times Daily, Disp: ,  Rfl:    traZODone (DESYREL) 50 MG tablet, Take 1 tablet (50 mg total) by mouth at bedtime as needed for sleep., Disp: 30 tablet, Rfl: 0   triamcinolone ointment (KENALOG) 0.1 %, Apply 1 application topically 2 (two) times daily as needed (Rash). Use for up to 2 weeks. Apply to affected areas of rash. Avoid applying to face, groin, and axilla. Use as directed., Disp: 453.6 g, Rfl: 1   vitamin B-12 (CYANOCOBALAMIN) 1000 MCG tablet, Take 1,000 mcg by mouth daily., Disp: , Rfl:      ROS:  Review of Systems  Constitutional:  Negative for fatigue, fever and unexpected weight change.  Respiratory:  Negative for cough, shortness of breath and wheezing.   Cardiovascular:  Negative for chest pain, palpitations and leg swelling.  Gastrointestinal:  Negative for blood in stool, constipation, diarrhea, nausea and vomiting.  Endocrine: Negative for cold intolerance, heat intolerance and polyuria.  Genitourinary:  Negative for dyspareunia, dysuria, flank pain, frequency, genital sores, hematuria, menstrual problem, pelvic pain, urgency, vaginal bleeding, vaginal discharge and vaginal pain.  Musculoskeletal:  Negative for back pain, joint swelling and myalgias.  Skin:  Negative for rash.  Neurological:  Negative for dizziness, syncope, light-headedness, numbness and headaches.  Hematological:  Negative for adenopathy.  Psychiatric/Behavioral:  Negative for agitation, confusion, sleep disturbance and suicidal ideas. The patient is not nervous/anxious.   BREAST: No symptoms    Objective: BP 120/70    Ht 5' 5.5" (1.664 m)    Wt 143 lb (64.9 kg)    LMP 01/09/2022 (Approximate)    BMI 23.43 kg/m    Physical Exam Constitutional:      Appearance: She is well-developed.  Genitourinary:     Vulva normal.     Right Labia: No rash, tenderness or lesions.    Left Labia: No tenderness, lesions or rash.    No vaginal discharge, erythema or tenderness.      Right Adnexa: not tender and no mass present.     Left Adnexa: not tender and no mass present.    No cervical friability or polyp.     Uterus is not enlarged or tender.  Breasts:    Right: No mass, nipple discharge, skin change or tenderness.     Left: No mass, nipple discharge, skin change or tenderness.  Neck:     Thyroid: No thyromegaly.  Cardiovascular:     Rate and Rhythm: Normal rate and regular rhythm.     Heart sounds: Normal heart sounds. No murmur heard. Pulmonary:     Effort: Pulmonary  effort is normal.     Breath sounds: Normal breath sounds.  Abdominal:     Palpations: Abdomen is soft.     Tenderness: There is no abdominal tenderness. There is no guarding or rebound.  Musculoskeletal:        General: Normal range of motion.     Cervical back: Normal range of motion.  Lymphadenopathy:     Cervical: No cervical adenopathy.  Neurological:     General: No focal deficit present.     Mental Status: She is alert and oriented to person, place, and time.     Cranial Nerves: No cranial nerve deficit.  Skin:    General: Skin is warm and dry.  Psychiatric:        Mood and Affect: Mood normal.        Behavior: Behavior normal.        Thought Content: Thought content normal.        Judgment: Judgment normal.  Vitals reviewed.    Assessment/Plan: Encounter for annual routine gynecological examination  Encounter for screening mammogram for malignant neoplasm of breast - Plan: MM 3D SCREEN BREAST BILATERAL; pt to sheds mammo at [facility]. Due for scr mammo, will call for ref prn.  Perimenopause--will d/c OCPs and follow cycles. F/u prn.   Other insomnia - Plan: traZODone (DESYREL) 50 MG tablet; Rx trazodone prn. F/u prn.   Blood tests for routine general physical examination - Plan: Comprehensive metabolic panel, Lipid panel  Screening cholesterol level - Plan: Lipid panel  Elevated LDL cholesterol level - Plan: Lipid panel   Meds ordered this encounter  Medications   traZODone (DESYREL) 50 MG tablet    Sig: Take 1  tablet (50 mg total) by mouth at bedtime as needed for sleep.    Dispense:  30 tablet    Refill:  0    Order Specific Question:   Supervising Provider    Answer:   Gae Dry [[id number]]            GYN counsel breast self exam, mammography screening, menopause, adequate intake of calcium and vitamin D, diet and exercise    F/U  Return in about 1 year (around 01/23/2023).  Ambar Raphael B. Nickie Warwick, PA-C 01/23/2022 10:25 AM

## 2022-01-23 ENCOUNTER — Other Ambulatory Visit: Payer: Self-pay

## 2022-01-23 ENCOUNTER — Ambulatory Visit (INDEPENDENT_AMBULATORY_CARE_PROVIDER_SITE_OTHER): Payer: BC Managed Care – PPO | Admitting: Obstetrics and Gynecology

## 2022-01-23 ENCOUNTER — Encounter: Payer: Self-pay | Admitting: Obstetrics and Gynecology

## 2022-01-23 VITALS — BP 120/70 | Ht 65.5 in | Wt 143.0 lb

## 2022-01-23 DIAGNOSIS — Z0000 Encounter for general adult medical examination without abnormal findings: Secondary | ICD-10-CM

## 2022-01-23 DIAGNOSIS — Z1231 Encounter for screening mammogram for malignant neoplasm of breast: Secondary | ICD-10-CM | POA: Diagnosis not present

## 2022-01-23 DIAGNOSIS — G4709 Other insomnia: Secondary | ICD-10-CM | POA: Diagnosis not present

## 2022-01-23 DIAGNOSIS — Z01419 Encounter for gynecological examination (general) (routine) without abnormal findings: Secondary | ICD-10-CM | POA: Diagnosis not present

## 2022-01-23 DIAGNOSIS — E7800 Pure hypercholesterolemia, unspecified: Secondary | ICD-10-CM

## 2022-01-23 DIAGNOSIS — Z13220 Encounter for screening for lipoid disorders: Secondary | ICD-10-CM

## 2022-01-23 DIAGNOSIS — N951 Menopausal and female climacteric states: Secondary | ICD-10-CM | POA: Diagnosis not present

## 2022-01-23 MED ORDER — TRAZODONE HCL 50 MG PO TABS: 50.0000 mg | ORAL_TABLET | Freq: Every evening | ORAL | 0 refills | Status: DC | PRN

## 2022-01-23 NOTE — Patient Instructions (Signed)
I value your feedback and you entrusting us with your care. If you get a [facility] patient survey, I would appreciate you taking the time to let us know about your experience today. Thank you!   [facility]: [phone]  

## 2022-01-23 NOTE — Addendum Note (Signed)
Addended by: Ardeth Perfect B on: 1/74/9449 04:15 PM   Modules accepted: Orders

## 2022-01-24 LAB — COMPREHENSIVE METABOLIC PANEL
ALT: 11 IU/L (ref 0–32)
AST: 19 IU/L (ref 0–40)
Albumin/Globulin Ratio: 2.1 (ref 1.2–2.2)
Albumin: 4.4 g/dL (ref 3.8–4.9)
Alkaline Phosphatase: 43 IU/L — ABNORMAL LOW (ref 44–121)
BUN/Creatinine Ratio: 11 (ref 9–23)
BUN: 10 mg/dL (ref 6–24)
Bilirubin Total: 0.3 mg/dL (ref 0.0–1.2)
CO2: 22 mmol/L (ref 20–29)
Calcium: 9.1 mg/dL (ref 8.7–10.2)
Chloride: 105 mmol/L (ref 96–106)
Creatinine, Ser: 0.89 mg/dL (ref 0.57–1.00)
Globulin, Total: 2.1 g/dL (ref 1.5–4.5)
Glucose: 88 mg/dL (ref 70–99)
Potassium: 4.2 mmol/L (ref 3.5–5.2)
Sodium: 141 mmol/L (ref 134–144)
Total Protein: 6.5 g/dL (ref 6.0–8.5)
eGFR: 78 mL/min/{1.73_m2} (ref >59)

## 2022-01-24 LAB — LIPID PANEL
Chol/HDL Ratio: 4.1 ratio (ref 0.0–4.4)
Cholesterol, Total: 222 mg/dL — ABNORMAL HIGH (ref 100–199)
HDL: 54 mg/dL (ref >39)
LDL Chol Calc (NIH): 138 mg/dL — ABNORMAL HIGH (ref 0–99)
Triglycerides: 166 mg/dL — ABNORMAL HIGH (ref 0–149)
VLDL Cholesterol Cal: 30 mg/dL (ref 5–40)

## 2022-02-15 ENCOUNTER — Other Ambulatory Visit: Payer: Self-pay | Admitting: Obstetrics and Gynecology

## 2022-03-14 ENCOUNTER — Encounter: Payer: Self-pay | Admitting: Obstetrics and Gynecology

## 2022-03-23 ENCOUNTER — Encounter: Payer: Self-pay | Admitting: Obstetrics and Gynecology

## 2022-03-25 ENCOUNTER — Other Ambulatory Visit: Payer: Self-pay | Admitting: Obstetrics and Gynecology

## 2022-03-25 DIAGNOSIS — Z3041 Encounter for surveillance of contraceptive pills: Secondary | ICD-10-CM

## 2022-05-22 ENCOUNTER — Ambulatory Visit (INDEPENDENT_AMBULATORY_CARE_PROVIDER_SITE_OTHER): Payer: BC Managed Care – PPO

## 2022-05-22 ENCOUNTER — Ambulatory Visit: Payer: BC Managed Care – PPO | Admitting: Podiatry

## 2022-05-22 DIAGNOSIS — M722 Plantar fascial fibromatosis: Secondary | ICD-10-CM

## 2022-05-22 NOTE — Progress Notes (Signed)
Subjective:  Patient ID: Jane Wells, female    DOB: 01/12/1971,  MRN: [id number]  No chief complaint on file.   51 y.o. female presents with the above complaint.  Patient presents with complaint bilateral heel pain that has been on for quite some time is progressive gotten worse.  Hurts with ambulation.  She states that she does a lot of walking.  She states that this started causing her some pain.  The right side is worse than left side.  She wanted get it evaluated pain scale 7 out of 10 hurts with ambulation   Review of Systems: Negative except as noted in the HPI. Denies N/V/F/Ch.  Past Medical History:  Diagnosis Date   Basal cell carcinoma 12/24/2019   Left calf. Superficial.    Crohn disease ([place])    18 yr   Fibroid    Screening for colon cancer 12/2019   Cologuard neg; repeat after 3 yrs   Squamous cell carcinoma of skin 12/24/2019   Left distal shin. Pigmented. SCCIs    Current Outpatient Medications:    cetirizine (ZYRTEC) 10 MG chewable tablet, Chew by mouth., Disp: , Rfl:    cholecalciferol (VITAMIN D) 1000 UNITS tablet, Take 1,000 Units by mouth daily., Disp: , Rfl:    fluticasone (FLONASE) 50 MCG/ACT nasal spray, Place into the nose., Disp: , Rfl:    hydrocortisone 2.5 % cream, Apply topically 2 (two) times daily as needed (Rash). to affected areas at face, underarms or groin area. Use for up to 2 weeks., Disp: 28 g, Rfl: 1   JUNEL FE 1/20 1-20 MG-MCG tablet, TAKE 1 TABLET BY MOUTH EVERY DAY, Disp: 84 tablet, Rfl: 0   mupirocin ointment (BACTROBAN) 2 %, SMARTSIG:1 Application Topical 2-3 Times Daily, Disp: , Rfl:    traZODone (DESYREL) 50 MG tablet, Take 1 tablet (50 mg total) by mouth at bedtime as needed for sleep., Disp: 30 tablet, Rfl: 0   triamcinolone ointment (KENALOG) 0.1 %, Apply 1 application topically 2 (two) times daily as needed (Rash). Use for up to 2 weeks. Apply to affected areas of rash. Avoid applying to face, groin, and axilla. Use as directed.,  Disp: 453.6 g, Rfl: 1   vitamin B-12 (CYANOCOBALAMIN) 1000 MCG tablet, Take 1,000 mcg by mouth daily., Disp: , Rfl:   Social History   Tobacco Use  Smoking Status Never  Smokeless Tobacco Never    No Known Allergies Objective:  There were no vitals filed for this visit. There is no height or weight on file to calculate BMI. Constitutional Well developed. Well nourished.  Vascular Dorsalis pedis pulses palpable bilaterally. Posterior tibial pulses palpable bilaterally. Capillary refill normal to all digits.  No cyanosis or clubbing noted. Pedal hair growth normal.  Neurologic Normal speech. Oriented to person, place, and time. Epicritic sensation to light touch grossly present bilaterally.  Dermatologic Nails well groomed and normal in appearance. No open wounds. No skin lesions.  Orthopedic: Normal joint ROM without pain or crepitus bilaterally. No visible deformities. Tender to palpation at the calcaneal tuber bilaterally. No pain with calcaneal squeeze bilaterally. Ankle ROM diminished range of motion bilaterally. Silfverskiold Test: positive bilaterally.   Radiographs: Taken and reviewed. No acute fractures or dislocations. No evidence of stress fracture.  Plantar heel spur absent. Posterior heel spur absent.  Pes cavus foot structure  Assessment:   1. Plantar fasciitis of right foot   2. Plantar fasciitis of left foot    Plan:  Patient was evaluated and treated and all  questions answered.  Plantar Fasciitis, bilaterally - XR reviewed as above.  - Educated on icing and stretching. Instructions given.  - Injection delivered to the plantar fascia as below. - DME: Plantar fascial brace dispensed to support the medial longitudinal arch of the foot and offload pressure from the heel and prevent arch collapse during weightbearing - Pharmacologic management: None  Procedure: Injection Tendon/Ligament Location: Bilateral plantar fascia at the glabrous junction; medial  approach. Skin Prep: alcohol Injectate: 0.5 cc 0.5% marcaine plain, 0.5 cc of 1% Lidocaine, 0.5 cc kenalog 10. Disposition: Patient tolerated procedure well. Injection site dressed with a band-aid.  No follow-ups on file.

## 2022-06-19 ENCOUNTER — Encounter: Payer: Self-pay | Admitting: Podiatry

## 2022-06-19 ENCOUNTER — Ambulatory Visit: Payer: BC Managed Care – PPO | Admitting: Podiatry

## 2022-06-19 DIAGNOSIS — M722 Plantar fascial fibromatosis: Secondary | ICD-10-CM

## 2022-06-19 NOTE — Progress Notes (Signed)
Subjective:  Patient ID: Jane Wells, female    DOB: 1970/12/26,  MRN: [id number]  Chief Complaint  Patient presents with   Plantar Fasciitis    "They're alright.  I can't walk without the braces on.  The right is still the worst one."    51 y.o. female presents with the above complaint.  Patient presents with follow-up of bilateral [place] fasciitis.  She states she is doing about the same.  The injection did not help much of braces does help when she is walking.  She would like to discuss next treatment plan.  She denies any other acute complaints   Review of Systems: Negative except as noted in the HPI. Denies N/V/F/Ch.  Past Medical History:  Diagnosis Date   Basal cell carcinoma 12/24/2019   Left calf. Superficial.    Crohn disease ([place])    18 yr   Fibroid    Screening for colon cancer 12/2019   Cologuard neg; repeat after 3 yrs   Squamous cell carcinoma of skin 12/24/2019   Left distal shin. Pigmented. SCCIs    Current Outpatient Medications:    cetirizine (ZYRTEC) 10 MG chewable tablet, Chew by mouth., Disp: , Rfl:    cholecalciferol (VITAMIN D) 1000 UNITS tablet, Take 1,000 Units by mouth daily., Disp: , Rfl:    fluticasone (FLONASE) 50 MCG/ACT nasal spray, Place into the nose., Disp: , Rfl:    hydrocortisone 2.5 % cream, Apply topically 2 (two) times daily as needed (Rash). to affected areas at face, underarms or groin area. Use for up to 2 weeks., Disp: 28 g, Rfl: 1   JUNEL FE 1/20 1-20 MG-MCG tablet, TAKE 1 TABLET BY MOUTH EVERY DAY, Disp: 84 tablet, Rfl: 0   mupirocin ointment (BACTROBAN) 2 %, SMARTSIG:1 Application Topical 2-3 Times Daily, Disp: , Rfl:    traZODone (DESYREL) 50 MG tablet, Take 1 tablet (50 mg total) by mouth at bedtime as needed for sleep., Disp: 30 tablet, Rfl: 0   triamcinolone ointment (KENALOG) 0.1 %, Apply 1 application topically 2 (two) times daily as needed (Rash). Use for up to 2 weeks. Apply to affected areas of rash. Avoid applying to  face, groin, and axilla. Use as directed., Disp: 453.6 g, Rfl: 1   vitamin B-12 (CYANOCOBALAMIN) 1000 MCG tablet, Take 1,000 mcg by mouth daily., Disp: , Rfl:   Social History   Tobacco Use  Smoking Status Never  Smokeless Tobacco Never    No Known Allergies Objective:  There were no vitals filed for this visit. There is no height or weight on file to calculate BMI. Constitutional Well developed. Well nourished.  Vascular Dorsalis pedis pulses palpable bilaterally. Posterior tibial pulses palpable bilaterally. Capillary refill normal to all digits.  No cyanosis or clubbing noted. Pedal hair growth normal.  Neurologic Normal speech. Oriented to person, place, and time. Epicritic sensation to light touch grossly present bilaterally.  Dermatologic Nails well groomed and normal in appearance. No open wounds. No skin lesions.  Orthopedic: Normal joint ROM without pain or crepitus bilaterally. No visible deformities. Tender to palpation at the calcaneal tuber bilaterally. No pain with calcaneal squeeze bilaterally. Ankle ROM diminished range of motion bilaterally. Silfverskiold Test: positive bilaterally.   Radiographs: Taken and reviewed. No acute fractures or dislocations. No evidence of stress fracture.  Plantar heel spur absent. Posterior heel spur absent.  Pes cavus foot structure  Assessment:   No diagnosis found.  Plan:  Patient was evaluated and treated and all questions answered.  Plantar Fasciitis,  right greater than left side - XR reviewed as above.  - Educated on icing and stretching. Instructions given.  -I will hold off on injection as previous injection has not helped. - DME: Cam boot placed on the right side she will continue applying pressure to the left side - Pharmacologic management: None    No follow-ups on file.

## 2022-07-12 ENCOUNTER — Encounter: Payer: Self-pay | Admitting: Dermatology

## 2022-07-12 ENCOUNTER — Ambulatory Visit (INDEPENDENT_AMBULATORY_CARE_PROVIDER_SITE_OTHER): Payer: BC Managed Care – PPO | Admitting: Dermatology

## 2022-07-12 DIAGNOSIS — L821 Other seborrheic keratosis: Secondary | ICD-10-CM

## 2022-07-12 DIAGNOSIS — L814 Other melanin hyperpigmentation: Secondary | ICD-10-CM

## 2022-07-12 DIAGNOSIS — T148XXA Other injury of unspecified body region, initial encounter: Secondary | ICD-10-CM

## 2022-07-12 DIAGNOSIS — S50811A Abrasion of right forearm, initial encounter: Secondary | ICD-10-CM | POA: Diagnosis not present

## 2022-07-12 DIAGNOSIS — D1800 Hemangioma unspecified site: Secondary | ICD-10-CM

## 2022-07-12 DIAGNOSIS — D229 Melanocytic nevi, unspecified: Secondary | ICD-10-CM

## 2022-07-12 DIAGNOSIS — Z85828 Personal history of other malignant neoplasm of skin: Secondary | ICD-10-CM | POA: Diagnosis not present

## 2022-07-12 DIAGNOSIS — Z86007 Personal history of in-situ neoplasm of skin: Secondary | ICD-10-CM

## 2022-07-12 DIAGNOSIS — Z1283 Encounter for screening for malignant neoplasm of skin: Secondary | ICD-10-CM

## 2022-07-12 DIAGNOSIS — L578 Other skin changes due to chronic exposure to nonionizing radiation: Secondary | ICD-10-CM

## 2022-07-12 NOTE — Patient Instructions (Addendum)
Recommend daily broad spectrum sunscreen SPF 30+ to sun-exposed areas, reapply every 2 hours as needed. Call for new or changing lesions.  Staying in the shade or wearing long sleeves, sun glasses (UVA+UVB protection) and wide brim hats (4-inch brim around the entire circumference of the hat) are also recommended for sun protection.   Recommend Niacinamide or Nicotinamide 528m twice per day to lower risk of non-melanoma skin cancer by approximately 25%. This is usually available at [employer].    Recommend taking Heliocare sun protection supplement daily in sunny weather for additional sun protection. For maximum protection on the sunniest days, you can take up to 2 capsules of regular Heliocare OR take 1 capsule of Heliocare Ultra. For prolonged exposure (such as a full day in the sun), you can repeat your dose of the supplement 4 hours after your first dose. Heliocare can be purchased at [employer] at some [facility] or at [email or web address]    Melanoma ABCDEs  Melanoma is the most dangerous type of skin cancer, and is the leading cause of death from skin disease.  You are more likely to develop melanoma if you: Have light-colored skin, light-colored eyes, or red or blond hair Spend a lot of time in the sun Tan regularly, either outdoors or in a tanning bed Have had blistering sunburns, especially during childhood Have a close family member who has had a melanoma Have atypical moles or large birthmarks  Early detection of melanoma is key since treatment is typically straightforward and cure rates are extremely high if we catch it early.   The first sign of melanoma is often a change in a mole or a new dark spot.  The ABCDE system is a way of remembering the signs of melanoma.  A for asymmetry:  The two halves do not match. B for border:  The edges of the growth are irregular. C for color:  A mixture of colors are present instead of an even brown color. D for diameter:   Melanomas are usually (but not always) greater than 627m- the size of a pencil eraser. E for evolution:  The spot keeps changing in size, shape, and color.  Please check your skin once per month between visits. You can use a small mirror in front and a large mirror behind you to keep an eye on the back side or your body.   If you see any new or changing lesions before your next follow-up, please call to schedule a visit.  Please continue daily skin protection including broad spectrum sunscreen SPF 30+ to sun-exposed areas, reapplying every 2 hours as needed when you're outdoors.   Staying in the shade or wearing long sleeves, sun glasses (UVA+UVB protection) and wide brim hats (4-inch brim around the entire circumference of the hat) are also recommended for sun protection.    Due to recent changes in healthcare laws, you may see results of your pathology and/or laboratory studies on MyChart before the doctors have had a chance to review them. We understand that in some cases there may be results that are confusing or concerning to you. Please understand that not all results are received at the same time and often the doctors may need to interpret multiple results in order to provide you with the best plan of care or course of treatment. Therefore, we ask that you please give [place] business days to thoroughly review all your results before contacting the office for clarification. Should we see a critical lab  result, you will be contacted sooner.   If You Need Anything After Your Visit  If you have any questions or concerns for your doctor, please call our main line at [phone] and press option 4 to reach your doctor's medical assistant. If no one answers, please leave a voicemail as directed and we will return your call as soon as possible. Messages left after 4 pm will be answered the following business day.   You may also send [place] a message via [place]. We typically respond to MyChart messages  within 1-2 business days.  For prescription refills, please ask your pharmacy to contact our office. Our fax number is [phone].  If you have an urgent issue when the clinic is closed that cannot wait until the next business day, you can page your doctor at the number below.    Please note that while we do our best to be available for urgent issues outside of office hours, we are not available 24/7.   If you have an urgent issue and are unable to reach [place], you may choose to seek medical care at your doctor's office, retail clinic, urgent care center, or emergency room.  If you have a medical emergency, please immediately call 911 or go to the emergency department.  Pager Numbers  - Dr. Nehemiah Massed: [phone]  - Dr. Laurence Ferrari: [phone]  - Dr. Nicole Kindred: [phone]  In the event of inclement weather, please call our main line at [phone] for an update on the status of any delays or closures.  Dermatology Medication Tips: Please keep the boxes that topical medications come in in order to help keep track of the instructions about where and how to use these. Pharmacies typically print the medication instructions only on the boxes and not directly on the medication tubes.   If your medication is too expensive, please contact our office at [phone] option 4 or send [place] a message through [place].   We are unable to tell what your co-pay for medications will be in advance as this is different depending on your insurance coverage. However, we may be able to find a substitute medication at lower cost or fill out paperwork to get insurance to cover a needed medication.   If a prior authorization is required to get your medication covered by your insurance company, please allow [place] 1-2 business days to complete this process.  Drug prices often vary depending on where the prescription is filled and some pharmacies may offer cheaper prices.  The website www.goodrx.com contains coupons for  medications through different pharmacies. The prices here do not account for what the cost may be with help from insurance (it may be cheaper with your insurance), but the website can give you the price if you did not use any insurance.  - You can print the associated coupon and take it with your prescription to the pharmacy.  - You may also stop by our office during regular business hours and pick up a GoodRx coupon card.  - If you need your prescription sent electronically to a different pharmacy, notify our office through [facility] or by phone at [phone] option 4.     Si Usted Necesita Algo Despus de Su Visita  Tambin puede enviarnos un mensaje a travs de Pharmacist, community. Por lo general respondemos a los mensajes de MyChart en el transcurso de 1 a 2 das hbiles.  Para renovar recetas, por favor pida a su farmacia que se ponga en contacto con nuestra oficina. Oswald Hillock  [facility] [phone].  Si tiene un asunto urgente cuando la clnica est cerrada y que no puede esperar hasta el siguiente da hbil, puede llamar/localizar a su doctor(a) al nmero que aparece a continuacin.   Por favor, tenga en cuenta que aunque hacemos todo lo posible para estar disponibles para asuntos urgentes fuera del horario de [place], no estamos disponibles las 24 horas del da, los 7 das de la [place].   Si tiene un problema urgente y no puede comunicarse con nosotros, puede optar por buscar atencin mdica  en el consultorio de su doctor(a), en una clnica privada, en un centro de atencin urgente o en una sala de emergencias.  Si tiene Engineering geologist, por favor llame inmediatamente al 911 o vaya a la sala de emergencias.  Nmeros de bper  - Dr. Nehemiah Massed: [phone]  - Dra. Moye: [phone]  - Dra. Nicole Kindred: [phone]  En caso de inclemencias del [place], por favor llame a Johnsie Kindred principal al [phone] para una actualizacin sobre el [place] de cualquier retraso o  cierre.  Consejos para la medicacin en dermatologa: Por favor, guarde las cajas en las que vienen los medicamentos de uso tpico para ayudarle a seguir las instrucciones sobre dnde y cmo usarlos. Las farmacias generalmente imprimen las instrucciones del medicamento slo en las cajas y no directamente en los tubos del [place].   Si su medicamento es muy caro, por favor, pngase en contacto con Zigmund Daniel llamando al [phone] y presione la opcin 4 o envenos un mensaje a travs de Pharmacist, community.   No podemos decirle cul ser su copago por los medicamentos por adelantado ya que esto es diferente dependiendo de la cobertura de su seguro. Sin embargo, es posible que podamos encontrar un medicamento sustituto a Electrical engineer un formulario para que el seguro cubra el medicamento que se considera necesario.   Si se requiere una autorizacin previa para que su compaa de seguros [place] su medicamento, por favor permtanos de 1 a 2 das hbiles para completar este proceso.  Los precios de los medicamentos varan con frecuencia dependiendo del Environmental consultant de dnde se surte la receta y alguna farmacias pueden ofrecer precios ms baratos.  El sitio web www.goodrx.com tiene cupones para medicamentos de Airline pilot. Los precios aqu no tienen en cuenta lo que podra costar con la ayuda del seguro (puede ser ms barato con su seguro), pero el sitio web puede darle el precio si no utiliz Research scientist (physical sciences).  - Puede imprimir el cupn correspondiente y llevarlo con su receta a la farmacia.  - Tambin puede pasar por nuestra oficina durante el horario de atencin regular y Charity fundraiser una tarjeta de cupones de GoodRx.  - Si necesita que su receta se enve electrnicamente a una farmacia diferente, informe a nuestra oficina a travs de MyChart de [place] o por telfono llamando al [phone] y presione la opcin 4.

## 2022-07-12 NOTE — Progress Notes (Signed)
Follow-Up Visit   Subjective  Jane Wells is a 51 y.o. female who presents for the following: Annual Exam (Here for skin cancer screening. Full body. Hx of BCC, Hx of SCC. ). The patient presents for Total-Body Skin Exam (TBSE) for skin cancer screening and mole check.  The patient has spots, moles and lesions to be evaluated, some may be new or changing and the patient has concerns that these could be cancer.  The following portions of the chart were reviewed this encounter and updated as appropriate:  Tobacco  Allergies  Meds  Problems  Med Hx  Surg Hx  Fam Hx      Review of Systems: No other skin or systemic complaints except as noted in HPI or Assessment and Plan.   Objective  Well appearing patient in no apparent distress; mood and affect are within normal limits.  A full examination was performed including scalp, head, eyes, ears, nose, lips, neck, chest, axillae, abdomen, back, buttocks, bilateral upper extremities, bilateral lower extremities, hands, feet, fingers, toes, fingernails, and toenails. All findings within normal limits unless otherwise noted below.  Right Forearm - Posterior excoriation   Assessment & Plan   History of Basal Cell Carcinoma of the Skin. Left calf, superficial. 12/24/2019. - No evidence of recurrence today - Recommend regular full body skin exams - Recommend daily broad spectrum sunscreen SPF 30+ to sun-exposed areas, reapply every 2 hours as needed.  - Call if any new or changing lesions are noted between office visits  History of Squamous Cell Carcinoma in Situ of the Skin. Left distal shin, pigmented. - No evidence of recurrence today - Recommend regular full body skin exams - Recommend daily broad spectrum sunscreen SPF 30+ to sun-exposed areas, reapply every 2 hours as needed.  - Call if any new or changing lesions are noted between office visits   Lentigines - Scattered tan macules - Due to sun exposure - Benign-appearing,  observe - Recommend daily broad spectrum sunscreen SPF 30+ to sun-exposed areas, reapply every 2 hours as needed. - Call for any changes  Seborrheic Keratoses - Stuck-on, waxy, tan-brown papules and/or plaques  - Benign-appearing - Discussed benign etiology and prognosis. - Observe - Call for any changes  Melanocytic Nevi - Tan-brown and/or pink-flesh-colored symmetric macules and papules - Benign appearing on exam today - Observation - Call clinic for new or changing moles - Recommend daily use of broad spectrum spf 30+ sunscreen to sun-exposed areas.   Hemangiomas - Red papules - Discussed benign nature - Observe - Call for any changes  Actinic Damage - Chronic condition, secondary to cumulative UV/sun exposure - diffuse scaly erythematous macules with underlying dyspigmentation - Recommend daily broad spectrum sunscreen SPF 30+ to sun-exposed areas, reapply every 2 hours as needed.  - Staying in the shade or wearing long sleeves, sun glasses (UVA+UVB protection) and wide brim hats (4-inch brim around the entire circumference of the hat) are also recommended for sun protection.  - Call for new or changing lesions.  Skin cancer screening performed today.  Excoriation Right Forearm - Posterior  No suspicious features on exam today but need to re-evaluate if this does not resolve. RTC if not resolved/healed in 6-8 weeks.    Return in about 1 year (around 07/13/2023) for TBSE, HxBCC, HxSCC.  I, Emelia Salisbury, CMA, am acting as scribe for Forest Gleason, MD.  Documentation: I have reviewed the above documentation for accuracy and completeness, and I agree with the above.  [name],  MD

## 2022-07-17 ENCOUNTER — Telehealth: Payer: Self-pay | Admitting: Podiatry

## 2022-07-17 ENCOUNTER — Ambulatory Visit (INDEPENDENT_AMBULATORY_CARE_PROVIDER_SITE_OTHER): Payer: BC Managed Care – PPO | Admitting: Podiatry

## 2022-07-17 DIAGNOSIS — M722 Plantar fascial fibromatosis: Secondary | ICD-10-CM | POA: Diagnosis not present

## 2022-07-17 DIAGNOSIS — Q667 Congenital pes cavus, unspecified foot: Secondary | ICD-10-CM | POA: Diagnosis not present

## 2022-07-17 NOTE — Telephone Encounter (Signed)
Pt called was seen today and needs to get the code for the PRP so she can call her insurance and also is wanting to get scheduled for the last week in august on that Wednesday. 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

## 2022-07-17 NOTE — Progress Notes (Signed)
Subjective:  Patient ID: Jane Wells, female    DOB: 14-Nov-1971,  MRN: [id number]  Chief Complaint  Patient presents with   Plantar Fasciitis    51 y.o. female presents with the above complaint.  Patient presents with follow-up of bilateral Planter fasciitis.  She states she is doing about the same.  The wound helps while she is in it but is not able to transition out of it.  She states it still hurts after the boot.  She denies any other acute complaints.  She would like to discuss treatment options.   Review of Systems: Negative except as noted in the HPI. Denies N/V/F/Ch.  Past Medical History:  Diagnosis Date   Basal cell carcinoma 12/24/2019   Left calf. Superficial.    Crohn disease ([place])    16 yr   Fibroid    Screening for colon cancer 12/2019   Cologuard neg; repeat after 3 yrs   Squamous cell carcinoma of skin 12/24/2019   Left distal shin. Pigmented. SCCIs    Current Outpatient Medications:    cetirizine (ZYRTEC) 10 MG chewable tablet, Chew by mouth., Disp: , Rfl:    cholecalciferol (VITAMIN D) 1000 UNITS tablet, Take 1,000 Units by mouth daily., Disp: , Rfl:    fluticasone (FLONASE) 50 MCG/ACT nasal spray, Place into the nose., Disp: , Rfl:    hydrocortisone 2.5 % cream, Apply topically 2 (two) times daily as needed (Rash). to affected areas at face, underarms or groin area. Use for up to 2 weeks., Disp: 28 g, Rfl: 1   JUNEL FE 1/20 1-20 MG-MCG tablet, TAKE 1 TABLET BY MOUTH EVERY DAY (Patient not taking: Reported on 07/12/2022), Disp: 84 tablet, Rfl: 0   mupirocin ointment (BACTROBAN) 2 %, SMARTSIG:1 Application Topical 2-3 Times Daily, Disp: , Rfl:    traZODone (DESYREL) 50 MG tablet, Take 1 tablet (50 mg total) by mouth at bedtime as needed for sleep., Disp: 30 tablet, Rfl: 0   triamcinolone ointment (KENALOG) 0.1 %, Apply 1 application topically 2 (two) times daily as needed (Rash). Use for up to 2 weeks. Apply to affected areas of rash. Avoid applying to face,  groin, and axilla. Use as directed., Disp: 453.6 g, Rfl: 1   vitamin B-12 (CYANOCOBALAMIN) 1000 MCG tablet, Take 1,000 mcg by mouth daily., Disp: , Rfl:   Social History   Tobacco Use  Smoking Status Never  Smokeless Tobacco Never    No Known Allergies Objective:  There were no vitals filed for this visit. There is no height or weight on file to calculate BMI. Constitutional Well developed. Well nourished.  Vascular Dorsalis pedis pulses palpable bilaterally. Posterior tibial pulses palpable bilaterally. Capillary refill normal to all digits.  No cyanosis or clubbing noted. Pedal hair growth normal.  Neurologic Normal speech. Oriented to person, place, and time. Epicritic sensation to light touch grossly present bilaterally.  Dermatologic Nails well groomed and normal in appearance. No open wounds. No skin lesions.  Orthopedic: Normal joint ROM without pain or crepitus bilaterally. No visible deformities. Tender to palpation at the calcaneal tuber bilaterally. No pain with calcaneal squeeze bilaterally. Ankle ROM diminished range of motion bilaterally. Silfverskiold Test: positive bilaterally.   Radiographs: Taken and reviewed. No acute fractures or dislocations. No evidence of stress fracture.  Plantar heel spur absent. Posterior heel spur absent.  Pes cavus foot structure  Assessment:   1. Plantar fasciitis of right foot   2. Plantar fasciitis of left foot   3. Pes cavus  Plan:  Patient was evaluated and treated and all questions answered.  Plantar Fasciitis, right greater than left side - XR reviewed as above.  - Educated on icing and stretching. Instructions given.  -I will hold off on injection as previous injection has not helped. - DME: Continue applying boot as needed - Pharmacologic management: None -Patient will think about PRP injection will get back to me when she is ready to undergo it.  Pes cavus -I explained to patient the etiology of This and  relationship with Planter fasciitis and various treatment options were discussed.  Given patient foot structure in the setting of Planter fasciitis I believe patient will benefit from custom-made orthotics to help control the hindfoot motion support the arch of the foot and take the stress away from plantar fascial.  Patient agrees with the plan like to proceed with orthotics -Patient was casted for orthotics     No follow-ups on file.

## 2022-07-23 ENCOUNTER — Encounter: Payer: Self-pay | Admitting: Dermatology

## 2022-08-01 ENCOUNTER — Other Ambulatory Visit: Payer: BC Managed Care – PPO

## 2022-08-02 ENCOUNTER — Ambulatory Visit (INDEPENDENT_AMBULATORY_CARE_PROVIDER_SITE_OTHER): Payer: BC Managed Care – PPO

## 2022-08-02 DIAGNOSIS — M722 Plantar fascial fibromatosis: Secondary | ICD-10-CM

## 2022-08-02 NOTE — Progress Notes (Signed)
Patient presents today to pick up custom molded foot orthotics recommended by Dr. Posey Pronto.   Orthotics were dispensed and fit was satisfactory. Reviewed instructions for break-in and wear. Written instructions given to patient.  Patient will follow up as needed.   [facility] - order # [id number]

## 2022-08-28 ENCOUNTER — Ambulatory Visit: Payer: BC Managed Care – PPO | Admitting: Podiatry

## 2022-08-28 DIAGNOSIS — M722 Plantar fascial fibromatosis: Secondary | ICD-10-CM

## 2022-08-28 MED ORDER — METHYLPREDNISOLONE 4 MG PO TBPK: ORAL_TABLET | ORAL | 0 refills | Status: DC

## 2022-08-28 MED ORDER — MELOXICAM 15 MG PO TABS: 15.0000 mg | ORAL_TABLET | Freq: Every day | ORAL | 0 refills | Status: DC

## 2022-08-28 NOTE — Progress Notes (Signed)
Subjective:  Patient ID: Jane Wells, female    DOB: 02-25-1971,  MRN: [id number]  Chief Complaint  Patient presents with   Plantar Fasciitis    Pt stated that her pain is about the same     51 y.o. female presents with the above complaint.  Patient presents for follow-up of bilateral plantar fasciitis.  She states the pain is about the same denies any other acute complaints she is going on a trip she is been doing lateral foot.  She would like to schedule for PRP.  Review of Systems: Negative except as noted in the HPI. Denies N/V/F/Ch.  Past Medical History:  Diagnosis Date   Basal cell carcinoma 12/24/2019   Left calf. Superficial.    Crohn disease ([place])    16 yr   Fibroid    Screening for colon cancer 12/2019   Cologuard neg; repeat after 3 yrs   Squamous cell carcinoma of skin 12/24/2019   Left distal shin. Pigmented. SCCIs    Current Outpatient Medications:    meloxicam (MOBIC) 15 MG tablet, Take 1 tablet (15 mg total) by mouth daily., Disp: 30 tablet, Rfl: 0   methylPREDNISolone (MEDROL DOSEPAK) 4 MG TBPK tablet, Take as directed, Disp: 21 each, Rfl: 0   cetirizine (ZYRTEC) 10 MG chewable tablet, Chew by mouth., Disp: , Rfl:    cholecalciferol (VITAMIN D) 1000 UNITS tablet, Take 1,000 Units by mouth daily., Disp: , Rfl:    fluticasone (FLONASE) 50 MCG/ACT nasal spray, Place into the nose., Disp: , Rfl:    hydrocortisone 2.5 % cream, Apply topically 2 (two) times daily as needed (Rash). to affected areas at face, underarms or groin area. Use for up to 2 weeks., Disp: 28 g, Rfl: 1   JUNEL FE 1/20 1-20 MG-MCG tablet, TAKE 1 TABLET BY MOUTH EVERY DAY (Patient not taking: Reported on 07/12/2022), Disp: 84 tablet, Rfl: 0   mupirocin ointment (BACTROBAN) 2 %, SMARTSIG:1 Application Topical 2-3 Times Daily, Disp: , Rfl:    traZODone (DESYREL) 50 MG tablet, Take 1 tablet (50 mg total) by mouth at bedtime as needed for sleep., Disp: 30 tablet, Rfl: 0   triamcinolone ointment  (KENALOG) 0.1 %, Apply 1 application topically 2 (two) times daily as needed (Rash). Use for up to 2 weeks. Apply to affected areas of rash. Avoid applying to face, groin, and axilla. Use as directed., Disp: 453.6 g, Rfl: 1   vitamin B-12 (CYANOCOBALAMIN) 1000 MCG tablet, Take 1,000 mcg by mouth daily., Disp: , Rfl:   Social History   Tobacco Use  Smoking Status Never  Smokeless Tobacco Never    No Known Allergies Objective:  There were no vitals filed for this visit. There is no height or weight on file to calculate BMI. Constitutional Well developed. Well nourished.  Vascular Dorsalis pedis pulses palpable bilaterally. Posterior tibial pulses palpable bilaterally. Capillary refill normal to all digits.  No cyanosis or clubbing noted. Pedal hair growth normal.  Neurologic Normal speech. Oriented to person, place, and time. Epicritic sensation to light touch grossly present bilaterally.  Dermatologic Nails well groomed and normal in appearance. No open wounds. No skin lesions.  Orthopedic: Normal joint ROM without pain or crepitus bilaterally. No visible deformities. Tender to palpation at the calcaneal tuber bilaterally. No pain with calcaneal squeeze bilaterally. Ankle ROM diminished range of motion bilaterally. Silfverskiold Test: positive bilaterally.   Radiographs: Taken and reviewed. No acute fractures or dislocations. No evidence of stress fracture.  Plantar heel spur absent. Posterior heel  spur absent.  Pes cavus foot structure  Assessment:   No diagnosis found.   Plan:  Patient was evaluated and treated and all questions answered.  Plantar Fasciitis, right greater than left side - XR reviewed as above.  - Educated on icing and stretching. Instructions given.  -I will hold off on injection as previous injection has not helped. - DME: Continue applying boot as needed - Pharmacologic management: None -Patient will get scheduled for PRP injection in 2-1/2  weeks  Pes cavus -I explained to patient the etiology of This and relationship with Planter fasciitis and various treatment options were discussed.  Given patient foot structure in the setting of Planter fasciitis I believe patient will benefit from custom-made orthotics to help control the hindfoot motion support the arch of the foot and take the stress away from plantar fascial.  Patient agrees with the plan like to proceed with orthotics -Ortho's were dispensed and they are functioning well.     No follow-ups on file.

## 2022-09-19 ENCOUNTER — Ambulatory Visit (INDEPENDENT_AMBULATORY_CARE_PROVIDER_SITE_OTHER): Payer: BC Managed Care – PPO | Admitting: Podiatry

## 2022-09-19 DIAGNOSIS — M79673 Pain in unspecified foot: Secondary | ICD-10-CM

## 2022-09-19 DIAGNOSIS — M722 Plantar fascial fibromatosis: Secondary | ICD-10-CM

## 2022-09-19 NOTE — Progress Notes (Signed)
PRP injection was given to the patient.  5 cc was placed into for 2 bilateral heel.  2 and half cc were injected in each heel.  No complication noted.  I will see her back in 1 month and we will reevaluate.

## 2022-10-23 ENCOUNTER — Ambulatory Visit: Payer: BC Managed Care – PPO | Admitting: Podiatry

## 2022-12-06 ENCOUNTER — Encounter: Payer: Self-pay | Admitting: Dermatology

## 2022-12-06 ENCOUNTER — Ambulatory Visit: Payer: BC Managed Care – PPO | Admitting: Dermatology

## 2022-12-06 ENCOUNTER — Other Ambulatory Visit: Payer: Self-pay

## 2022-12-06 VITALS — BP 126/81 | HR 102

## 2022-12-06 DIAGNOSIS — M6740 Ganglion, unspecified site: Secondary | ICD-10-CM

## 2022-12-06 DIAGNOSIS — M67442 Ganglion, left hand: Secondary | ICD-10-CM | POA: Diagnosis not present

## 2022-12-06 NOTE — Progress Notes (Signed)
   Follow-Up Visit   Subjective  Jane Wells is a 52 y.o. female who presents for the following: Irregular lesions (On the L fingers x 1 months - sore, tender, patient is concerned and would like lesions checked).   The following portions of the chart were reviewed this encounter and updated as appropriate:   Tobacco  Allergies  Meds  Problems  Med Hx  Surg Hx  Fam Hx      Review of Systems:  No other skin or systemic complaints except as noted in HPI or Assessment and Plan.  Objective  Well appearing patient in no apparent distress; mood and affect are within normal limits.  A focused examination was performed including the hands. Relevant physical exam findings are noted in the Assessment and Plan.  L 5th PIP and adjacent to L PIP joint Compressible, connected subcutaneous papules adjacent to PIP joint    Assessment & Plan  Ganglion cyst L 5th PIP and adjacent to L PIP joint  Symptomatic. Discussed conservative treatment options of topical steroid and covering with bandage and LN2 for possible temporary relief. Recommend referral to hand surgeon for best treatment options. Will refer to [facility].   Start TMC 0.1% cream and cover with bandage QD for up to 2 weeks. Topical steroids (such as triamcinolone, fluocinolone, fluocinonide, mometasone, clobetasol, halobetasol, betamethasone, hydrocortisone) can cause thinning and lightening of the skin if they are used for too long in the same area. Your physician has selected the right strength medicine for your problem and area affected on the body. Please use your medication only as directed by your physician to prevent side effects.      Return for appointment as scheduled.  Luther Redo, CMA, am acting as scribe for Forest Gleason, MD .  Documentation: I have reviewed the above documentation for accuracy and completeness, and I agree with the above.  Forest Gleason, MD

## 2022-12-06 NOTE — Patient Instructions (Addendum)
Dorsal proximal interphalangeal (PIP) joint ganglion - will refer to [facility] for treatment options.   Start Triamcinolone 0.1% ointment that you have already. Apply to affected areas once daily and cover with bandage. Use up to two weeks.   Due to recent changes in healthcare laws, you may see results of your pathology and/or laboratory studies on MyChart before the doctors have had a chance to review them. We understand that in some cases there may be results that are confusing or concerning to you. Please understand that not all results are received at the same time and often the doctors may need to interpret multiple results in order to provide you with the best plan of care or course of treatment. Therefore, we ask that you please give [place] 2 business days to thoroughly review all your results before contacting the office for clarification. Should we see a critical lab result, you will be contacted sooner.   If You Need Anything After Your Visit  If you have any questions or concerns for your doctor, please call our main line at [phone] and press option 4 to reach your doctor's medical assistant. If no one answers, please leave a voicemail as directed and we will return your call as soon as possible. Messages left after 4 pm will be answered the following business day.   You may also send [place] a message via [place]. We typically respond to MyChart messages within 1-2 business days.  For prescription refills, please ask your pharmacy to contact our office. Our fax number is [phone].  If you have an urgent issue when the clinic is closed that cannot wait until the next business day, you can page your doctor at the number below.    Please note that while we do our best to be available for urgent issues outside of office hours, we are not available 24/7.   If you have an urgent issue and are unable to reach [place], you may choose to seek medical care at your doctor's office, retail clinic,  urgent care center, or emergency room.  If you have a medical emergency, please immediately call 911 or go to the emergency department.  Pager Numbers  - Dr. Nehemiah Massed: [phone]  - Dr. Laurence Ferrari: [phone]  - Dr. Nicole Kindred: [phone]  In the event of inclement weather, please call our main line at [phone] for an update on the status of any delays or closures.  Dermatology Medication Tips: Please keep the boxes that topical medications come in in order to help keep track of the instructions about where and how to use these. Pharmacies typically print the medication instructions only on the boxes and not directly on the medication tubes.   If your medication is too expensive, please contact our office at [phone] option 4 or send [place] a message through [place].   We are unable to tell what your co-pay for medications will be in advance as this is different depending on your insurance coverage. However, we may be able to find a substitute medication at lower cost or fill out paperwork to get insurance to cover a needed medication.   If a prior authorization is required to get your medication covered by your insurance company, please allow [place] 1-2 business days to complete this process.  Drug prices often vary depending on where the prescription is filled and some pharmacies may offer cheaper prices.  The website www.goodrx.com contains coupons for medications through different pharmacies. The prices here do not account for what  the cost may be with help from insurance (it may be cheaper with your insurance), but the website can give you the price if you did not use any insurance.  - You can print the associated coupon and take it with your prescription to the pharmacy.  - You may also stop by our office during regular business hours and pick up a GoodRx coupon card.  - If you need your prescription sent electronically to a different pharmacy, notify our office through [facility] or by phone at [phone] option 4.     Si Usted Necesita Algo Despus de Su Visita  Tambin puede enviarnos un mensaje a travs de Pharmacist, community. Por lo general respondemos a los mensajes de MyChart en el transcurso de 1 a 2 das hbiles.  Para renovar recetas, por favor pida a su farmacia que se ponga en contacto con nuestra oficina. Harland Dingwall de fax es [place] [phone].  Si tiene un asunto urgente cuando la clnica est cerrada y que no puede esperar hasta el siguiente da hbil, puede llamar/localizar a su doctor(a) al nmero que aparece a continuacin.   Por favor, tenga en cuenta que aunque hacemos todo lo posible para estar disponibles para asuntos urgentes fuera del horario de [place], no estamos disponibles las 24 horas del da, los 7 das de la [place].   Si tiene un problema urgente y no puede comunicarse con nosotros, puede optar por buscar atencin mdica  en el consultorio de su doctor(a), en una clnica privada, en un centro de atencin urgente o en una sala de emergencias.  Si tiene Engineering geologist, por favor llame inmediatamente al 911 o vaya a la sala de emergencias.  Nmeros de bper  - Dr. Nehemiah Massed: [phone]  - Dra. Moye: [phone]  - Dra. Nicole Kindred: [phone]  En caso de inclemencias del [place], por favor llame a Johnsie Kindred principal al [phone] para una actualizacin sobre el [place] de cualquier retraso o cierre.  Consejos para la medicacin en dermatologa: Por favor, guarde las cajas en las que vienen los medicamentos de uso tpico para ayudarle a seguir las instrucciones sobre dnde y cmo usarlos. Las farmacias generalmente imprimen las instrucciones del medicamento slo en las cajas y no directamente en los tubos del [place].   Si su medicamento es muy caro, por favor, pngase en contacto con Zigmund Daniel llamando al [phone] y presione la opcin 4 o envenos un mensaje a travs de Pharmacist, community.   No podemos decirle cul  ser su copago por los medicamentos por adelantado ya que esto es diferente dependiendo de la cobertura de su seguro. Sin embargo, es posible que podamos encontrar un medicamento sustituto a Electrical engineer un formulario para que el seguro cubra el medicamento que se considera necesario.   Si se requiere una autorizacin previa para que su compaa de seguros [place] su medicamento, por favor permtanos de 1 a 2 das hbiles para completar este proceso.  Los precios de los medicamentos varan con frecuencia dependiendo del Environmental consultant de dnde se surte la receta y alguna farmacias pueden ofrecer precios ms baratos.  El sitio web www.goodrx.com tiene cupones para medicamentos de Airline pilot. Los precios aqu no tienen en cuenta lo que podra costar con la ayuda del seguro (puede ser ms barato con su seguro), pero el sitio web puede darle el precio si no utiliz Research scientist (physical sciences).  - Puede imprimir el cupn correspondiente y llevarlo con su receta a la farmacia.  - Tambin puede pasar  por nuestra oficina durante el horario de atencin regular y Charity fundraiser una tarjeta de cupones de GoodRx.  - Si necesita que su receta se enve electrnicamente a una farmacia diferente, informe a nuestra oficina a travs de MyChart de [place] o por telfono llamando al [phone] y presione la opcin 4.

## 2022-12-06 NOTE — Progress Notes (Signed)
See OV, referral to hand surgeon for ganglion cyst needed in [place], [place].

## 2022-12-07 ENCOUNTER — Encounter: Payer: Self-pay | Admitting: Dermatology

## 2022-12-14 ENCOUNTER — Ambulatory Visit: Payer: BC Managed Care – PPO | Admitting: Podiatry

## 2022-12-21 ENCOUNTER — Telehealth: Payer: Self-pay | Admitting: Obstetrics and Gynecology

## 2022-12-21 NOTE — Telephone Encounter (Signed)
Patient has question about her cologuard. Wondering if she should do it before her annual appt in February.   Cb# [phone]

## 2022-12-21 NOTE — Telephone Encounter (Signed)
Pt aware.

## 2022-12-21 NOTE — Telephone Encounter (Signed)
We can discuss colon cancer screening at her 2/24 appt.

## 2023-01-28 NOTE — Progress Notes (Unsigned)
PCP: Patient, No Pcp Per   No chief complaint on file.   HPI:      [name] is a 52 y.o. No obstetric history on file. who LMP was No LMP recorded. (Menstrual status: Oral contraceptives)., presents today for her annual examination.  Her menses are regular every 28-30 days, lasting few days, dark spotting only, no dysmen, on OCPs for hx of fibroid.  LMP had 2 days of real flow.  She does not have intermenstrual bleeding. Didn't have bleeding in past with sprintec per pt but does with junel 1/20 (not sure when change done since I first saw pt 1/21).   Sex activity: single partner, contraception - vasectomy. No pain/bleeding.  Last Pap: November 03, 2018  Results were: no abnormalities /neg HPV DNA. No hx of abn paps.  Last mammogram: 03/12/22 at [facility]; Results were: normal; hx of stable RT breast with dx mammo and /us in past. Due for screening mammo now.  There is no FH of breast cancer. There is no FH of ovarian cancer. The patient does do self-breast exams.  Colonoscopy: never, NEG cologuard 1/21; repeat due after 3 yrs.    Tobacco use: The patient denies current or previous tobacco use. Alcohol use: glass of wine with dinner  No drug use Exercise: very active  She does get adequate calcium and Vitamin D in her diet.  Had borderline lipids/TGs 2/23, due for lab recheck today.  Pt having trouble sleeping some nights, not improved with OTC sleep meds. Melatonin makes sx worse. Would like Rx to take occas for sleep. Has also been under increased stress.   Past Medical History:  Diagnosis Date   Basal cell carcinoma 12/24/2019   Left calf. Superficial.    Crohn disease ([place])    16 yr   Fibroid    Screening for colon cancer 12/2019   Cologuard neg; repeat after 3 yrs   Squamous cell carcinoma of skin 12/24/2019   Left distal shin. Pigmented. SCCIs    Past Surgical History:  Procedure Laterality Date   BLADDER SURGERY      Family History  Problem Relation Age  of Onset   Hypertension Mother    Diabetes Mother    Cervical cancer Mother    Multiple sclerosis Father    Hypertension Father    Lung cancer Father    Cervical cancer Maternal Aunt        80s   Cervical cancer Paternal Grandmother    Breast cancer Other    Ovarian cancer Neg Hx     Social History   Socioeconomic History   Marital status: Married    Spouse name: Not on file   Number of children: Not on file   Years of education: Not on file   Highest education level: Not on file  Occupational History   Not on file  Tobacco Use   Smoking status: Never   Smokeless tobacco: Never  Vaping Use   Vaping Use: Never used  Substance and Sexual Activity   Alcohol use: Yes    Comment: wine 1-2 glasse a night   Drug use: No   Sexual activity: Yes    Birth control/protection: Pill, Surgical    Comment: Vasectomy  Other Topics Concern   Not on file  Social History Narrative   Not on file   Social Determinants of Health   Financial Resource Strain: Not on file  Food Insecurity: Not on file  Transportation Needs: Not on file  Physical Activity: Not on file  Stress: Not on file  Social Connections: Not on file  Intimate Partner Violence: Not on file     Current Outpatient Medications:    cetirizine (ZYRTEC) 10 MG chewable tablet, Chew by mouth., Disp: , Rfl:    cholecalciferol (VITAMIN D) 1000 UNITS tablet, Take 1,000 Units by mouth daily., Disp: , Rfl:    fluticasone (FLONASE) 50 MCG/ACT nasal spray, Place into the nose., Disp: , Rfl:    hydrocortisone 2.5 % cream, Apply topically 2 (two) times daily as needed (Rash). to affected areas at face, underarms or groin area. Use for up to 2 weeks., Disp: 28 g, Rfl: 1   JUNEL FE 1/20 1-20 MG-MCG tablet, TAKE 1 TABLET BY MOUTH EVERY DAY (Patient not taking: Reported on 07/12/2022), Disp: 84 tablet, Rfl: 0   meloxicam (MOBIC) 15 MG tablet, Take 1 tablet (15 mg total) by mouth daily., Disp: 30 tablet, Rfl: 0   methylPREDNISolone  (MEDROL DOSEPAK) 4 MG TBPK tablet, Take as directed, Disp: 21 each, Rfl: 0   mupirocin ointment (BACTROBAN) 2 %, SMARTSIG:1 Application Topical 2-3 Times Daily, Disp: , Rfl:    traZODone (DESYREL) 50 MG tablet, Take 1 tablet (50 mg total) by mouth at bedtime as needed for sleep., Disp: 30 tablet, Rfl: 0   triamcinolone ointment (KENALOG) 0.1 %, Apply 1 application topically 2 (two) times daily as needed (Rash). Use for up to 2 weeks. Apply to affected areas of rash. Avoid applying to face, groin, and axilla. Use as directed., Disp: 453.6 g, Rfl: 1   vitamin B-12 (CYANOCOBALAMIN) 1000 MCG tablet, Take 1,000 mcg by mouth daily., Disp: , Rfl:      ROS:  Review of Systems  Constitutional:  Negative for fatigue, fever and unexpected weight change.  Respiratory:  Negative for cough, shortness of breath and wheezing.   Cardiovascular:  Negative for chest pain, palpitations and leg swelling.  Gastrointestinal:  Negative for blood in stool, constipation, diarrhea, nausea and vomiting.  Endocrine: Negative for cold intolerance, heat intolerance and polyuria.  Genitourinary:  Negative for dyspareunia, dysuria, flank pain, frequency, genital sores, hematuria, menstrual problem, pelvic pain, urgency, vaginal bleeding, vaginal discharge and vaginal pain.  Musculoskeletal:  Negative for back pain, joint swelling and myalgias.  Skin:  Negative for rash.  Neurological:  Negative for dizziness, syncope, light-headedness, numbness and headaches.  Hematological:  Negative for adenopathy.  Psychiatric/Behavioral:  Negative for agitation, confusion, sleep disturbance and suicidal ideas. The patient is not nervous/anxious.    BREAST: No symptoms    Objective: There were no vitals taken for this visit.   Physical Exam Constitutional:      Appearance: She is well-developed.  Genitourinary:     Vulva normal.     Right Labia: No rash, tenderness or lesions.    Left Labia: No tenderness, lesions or rash.     No vaginal discharge, erythema or tenderness.      Right Adnexa: not tender and no mass present.    Left Adnexa: not tender and no mass present.    No cervical friability or polyp.     Uterus is not enlarged or tender.  Breasts:    Right: No mass, nipple discharge, skin change or tenderness.     Left: No mass, nipple discharge, skin change or tenderness.  Neck:     Thyroid: No thyromegaly.  Cardiovascular:     Rate and Rhythm: Normal rate and regular rhythm.     Heart sounds: Normal heart sounds.  No murmur heard. Pulmonary:     Effort: Pulmonary effort is normal.     Breath sounds: Normal breath sounds.  Abdominal:     Palpations: Abdomen is soft.     Tenderness: There is no abdominal tenderness. There is no guarding or rebound.  Musculoskeletal:        General: Normal range of motion.     Cervical back: Normal range of motion.  Lymphadenopathy:     Cervical: No cervical adenopathy.  Neurological:     General: No focal deficit present.     Mental Status: She is alert and oriented to person, place, and time.     Cranial Nerves: No cranial nerve deficit.  Skin:    General: Skin is warm and dry.  Psychiatric:        Mood and Affect: Mood normal.        Behavior: Behavior normal.        Thought Content: Thought content normal.        Judgment: Judgment normal.  Vitals reviewed.     Assessment/Plan: Encounter for annual routine gynecological examination  Encounter for screening mammogram for malignant neoplasm of breast - Plan: MM 3D SCREEN BREAST BILATERAL; [facility] mamm[facility]. Due for scr mammo, will call for ref prn.  Perimenopause--will d/c OCPs and follow cycles. F/u prn.   Other insomnia - Plan: traZODone (DESYREL) 50 MG tablet; Rx trazodone prn. F/u prn.   Blood tests for routine general physical examination - Plan: Comprehensive metabolic panel, Lipid panel  Screening cholesterol level - Plan: Lipid panel  Elevated LDL cholesterol level - Plan: Lipid  panel   No orders of the defined types were placed in this encounter.           GYN counsel breast self exam, mammography screening, menopause, adequate intake of calcium and vitamin D, diet and exercise    F/U  No follow-ups on file.  Andrey Mccaskill B. Edel Rivero, PA-C 01/28/2023 7:11 PM

## 2023-01-29 ENCOUNTER — Encounter: Payer: Self-pay | Admitting: Obstetrics and Gynecology

## 2023-01-29 ENCOUNTER — Ambulatory Visit (INDEPENDENT_AMBULATORY_CARE_PROVIDER_SITE_OTHER): Payer: BC Managed Care – PPO | Admitting: Obstetrics and Gynecology

## 2023-01-29 ENCOUNTER — Other Ambulatory Visit (HOSPITAL_COMMUNITY)
Admission: RE | Admit: 2023-01-29 | Discharge: 2023-01-29 | Disposition: A | Discharge status: Home / Self Care | Payer: BC Managed Care – PPO | Source: Ambulatory Visit | Attending: Obstetrics and Gynecology | Admitting: Obstetrics and Gynecology

## 2023-01-29 VITALS — BP 100/60 | Ht 65.5 in | Wt 148.0 lb

## 2023-01-29 DIAGNOSIS — G4709 Other insomnia: Secondary | ICD-10-CM

## 2023-01-29 DIAGNOSIS — N951 Menopausal and female climacteric states: Secondary | ICD-10-CM

## 2023-01-29 DIAGNOSIS — Z0000 Encounter for general adult medical examination without abnormal findings: Secondary | ICD-10-CM

## 2023-01-29 DIAGNOSIS — Z1151 Encounter for screening for human papillomavirus (HPV): Secondary | ICD-10-CM

## 2023-01-29 DIAGNOSIS — Z124 Encounter for screening for malignant neoplasm of cervix: Secondary | ICD-10-CM | POA: Insufficient documentation

## 2023-01-29 DIAGNOSIS — Z13220 Encounter for screening for lipoid disorders: Secondary | ICD-10-CM

## 2023-01-29 DIAGNOSIS — Z1231 Encounter for screening mammogram for malignant neoplasm of breast: Secondary | ICD-10-CM

## 2023-01-29 DIAGNOSIS — E7800 Pure hypercholesterolemia, unspecified: Secondary | ICD-10-CM

## 2023-01-29 DIAGNOSIS — Z01419 Encounter for gynecological examination (general) (routine) without abnormal findings: Secondary | ICD-10-CM | POA: Diagnosis not present

## 2023-01-29 DIAGNOSIS — Z1211 Encounter for screening for malignant neoplasm of colon: Secondary | ICD-10-CM

## 2023-01-29 NOTE — Patient Instructions (Signed)
I value your feedback and you entrusting us with your care. If you get a  patient survey, I would appreciate you taking the time to let us know about your experience today. Thank you! ? ? ?

## 2023-01-30 ENCOUNTER — Encounter: Payer: Self-pay | Admitting: Obstetrics and Gynecology

## 2023-01-30 LAB — LIPID PANEL
Chol/HDL Ratio: 4 ratio (ref 0.0–4.4)
Cholesterol, Total: 273 mg/dL — ABNORMAL HIGH (ref 100–199)
HDL: 68 mg/dL (ref >39)
LDL Chol Calc (NIH): 179 mg/dL — ABNORMAL HIGH (ref 0–99)
Triglycerides: 146 mg/dL (ref 0–149)
VLDL Cholesterol Cal: 26 mg/dL (ref 5–40)

## 2023-01-30 LAB — COMPREHENSIVE METABOLIC PANEL
ALT: 23 IU/L (ref 0–32)
AST: 20 IU/L (ref 0–40)
Albumin/Globulin Ratio: 2.2 (ref 1.2–2.2)
Albumin: 4.7 g/dL (ref 3.8–4.9)
Alkaline Phosphatase: 107 IU/L (ref 44–121)
BUN/Creatinine Ratio: 29 — ABNORMAL HIGH (ref 9–23)
BUN: 25 mg/dL — ABNORMAL HIGH (ref 6–24)
Bilirubin Total: 0.4 mg/dL (ref 0.0–1.2)
CO2: 24 mmol/L (ref 20–29)
Calcium: 10 mg/dL (ref 8.7–10.2)
Chloride: 103 mmol/L (ref 96–106)
Creatinine, Ser: 0.87 mg/dL (ref 0.57–1.00)
Globulin, Total: 2.1 g/dL (ref 1.5–4.5)
Glucose: 84 mg/dL (ref 70–99)
Potassium: 4.5 mmol/L (ref 3.5–5.2)
Sodium: 143 mmol/L (ref 134–144)
Total Protein: 6.8 g/dL (ref 6.0–8.5)
eGFR: 80 mL/min/{1.73_m2} (ref >59)

## 2023-01-30 LAB — CYTOLOGY - PAP
Adequacy: ABSENT
Comment: NEGATIVE
Diagnosis: NEGATIVE
High risk HPV: NEGATIVE

## 2023-02-14 LAB — COLOGUARD: COLOGUARD: NEGATIVE

## 2023-03-14 ENCOUNTER — Encounter: Payer: Self-pay | Admitting: Obstetrics and Gynecology

## 2023-03-14 LAB — HM MAMMOGRAPHY

## 2023-03-20 ENCOUNTER — Encounter: Payer: Self-pay | Admitting: Obstetrics and Gynecology

## 2023-04-01 ENCOUNTER — Encounter: Payer: Self-pay | Admitting: Obstetrics and Gynecology

## 2023-04-01 DIAGNOSIS — E7800 Pure hypercholesterolemia, unspecified: Secondary | ICD-10-CM

## 2023-04-01 DIAGNOSIS — E7801 Familial hypercholesterolemia: Secondary | ICD-10-CM

## 2023-04-09 ENCOUNTER — Ambulatory Visit
Admission: RE | Admit: 2023-04-09 | Discharge: 2023-04-09 | Disposition: A | Discharge status: Home / Self Care | Payer: No Typology Code available for payment source | Source: Ambulatory Visit | Attending: Obstetrics and Gynecology | Admitting: Obstetrics and Gynecology

## 2023-04-09 DIAGNOSIS — E7801 Familial hypercholesterolemia: Secondary | ICD-10-CM

## 2023-05-22 ENCOUNTER — Ambulatory Visit: Payer: BC Managed Care – PPO | Admitting: Dermatology

## 2023-05-22 DIAGNOSIS — Z85828 Personal history of other malignant neoplasm of skin: Secondary | ICD-10-CM

## 2023-05-22 DIAGNOSIS — L820 Inflamed seborrheic keratosis: Secondary | ICD-10-CM

## 2023-05-22 DIAGNOSIS — L814 Other melanin hyperpigmentation: Secondary | ICD-10-CM | POA: Diagnosis not present

## 2023-05-22 NOTE — Patient Instructions (Signed)
Cryotherapy Aftercare  Wash gently with soap and water everyday.   Apply Vaseline and Band-Aid daily until healed.    Due to recent changes in healthcare laws, you may see results of your pathology and/or laboratory studies on MyChart before the doctors have had a chance to review them. We understand that in some cases there may be results that are confusing or concerning to you. Please understand that not all results are received at the same time and often the doctors may need to interpret multiple results in order to provide you with the best plan of care or course of treatment. Therefore, we ask that you please give us 2 business days to thoroughly review all your results before contacting the office for clarification. Should we see a critical lab result, you will be contacted sooner.   If You Need Anything After Your Visit  If you have any questions or concerns for your doctor, please call our main line at [phone] and press option 4 to reach your doctor's medical assistant. If no one answers, please leave a voicemail as directed and we will return your call as soon as possible. Messages left after 4 pm will be answered the following business day.   You may also send us a message via MyChart. We typically respond to MyChart messages within 1-2 business days.  For prescription refills, please ask your pharmacy to contact our office. Our fax number is [phone].  If you have an urgent issue when the clinic is closed that cannot wait until the next business day, you can page your doctor at the number below.    Please note that while we do our best to be available for urgent issues outside of office hours, we are not available 24/7.   If you have an urgent issue and are unable to reach us, you may choose to seek medical care at your doctor's office, retail clinic, urgent care center, or emergency room.  If you have a medical emergency, please immediately call 911 or go to the emergency  department.  Pager Numbers  - Dr. Kowalski: [phone]  - Dr. Moye: [phone]  - Dr. Stewart: [phone]  In the event of inclement weather, please call our main line at [phone] for an update on the status of any delays or closures.  Dermatology Medication Tips: Please keep the boxes that topical medications come in in order to help keep track of the instructions about where and how to use these. Pharmacies typically print the medication instructions only on the boxes and not directly on the medication tubes.   If your medication is too expensive, please contact our office at [phone] option 4 or send us a message through MyChart.   We are unable to tell what your co-pay for medications will be in advance as this is different depending on your insurance coverage. However, we may be able to find a substitute medication at lower cost or fill out paperwork to get insurance to cover a needed medication.   If a prior authorization is required to get your medication covered by your insurance company, please allow us 1-2 business days to complete this process.  Drug prices often vary depending on where the prescription is filled and some pharmacies may offer cheaper prices.  The website www.goodrx.com contains coupons for medications through different pharmacies. The prices here do not account for what the cost may be with help from insurance (it may be cheaper with your insurance), but the website can give   you the price if you did not use any insurance.  - You can print the associated coupon and take it with your prescription to the pharmacy.  - You may also stop by our office during regular business hours and pick up a GoodRx coupon card.  - If you need your prescription sent electronically to a different pharmacy, notify our office through [place] MyChart or by phone at [phone] option 4.    

## 2023-05-22 NOTE — Progress Notes (Signed)
   Follow-Up Visit   Subjective  Jane Wells is a 52 y.o. female who presents for the following: a spot at back that she is concerned about that has changed. Patient's husband said it has gotten crusty at edge. Pt with hx of BCC and SCC.   The following portions of the chart were reviewed this encounter and updated as appropriate: medications, allergies, medical history  Review of Systems:  No other skin or systemic complaints except as noted in HPI or Assessment and Plan.  Objective  Well appearing patient in no apparent distress; mood and affect are within normal limits.   A focused examination was performed of the following areas: back  Relevant exam findings are noted in the Assessment and Plan.  right upper back Erythematous stuck-on, waxy papule    Assessment & Plan   LENTIGINES Exam: scattered tan macules Due to sun exposure Treatment Plan: Benign-appearing, observe. Recommend daily broad spectrum sunscreen SPF 30+ to sun-exposed areas, reapply every 2 hours as needed.  Call for any changes   Inflamed seborrheic keratosis right upper back  Symptomatic, irritating, patient would like treated.  Benign-appearing.  Call clinic for new or changing lesions.    Destruction of lesion - right upper back  Destruction method: cryotherapy   Informed consent: discussed and consent obtained   Lesion destroyed using liquid nitrogen: Yes   Region frozen until ice ball extended beyond lesion: Yes   Outcome: patient tolerated procedure well with no complications   Post-procedure details: wound care instructions given   Additional details:  Prior to procedure, discussed risks of blister formation, small wound, skin dyspigmentation, or rare scar following cryotherapy. Recommend Vaseline ointment to treated areas while healing.     Return for TBSE, as scheduled, Hx BCC, Hx SCC.  Anise Salvo, RMA, am acting as scribe for Willeen Niece, MD .   Documentation: I have  reviewed the above documentation for accuracy and completeness, and I agree with the above.  Willeen Niece, MD

## 2023-07-31 ENCOUNTER — Encounter: Payer: Self-pay | Admitting: Dermatology

## 2023-07-31 ENCOUNTER — Encounter: Payer: BC Managed Care – PPO | Admitting: Dermatology

## 2023-07-31 ENCOUNTER — Ambulatory Visit (INDEPENDENT_AMBULATORY_CARE_PROVIDER_SITE_OTHER): Payer: BC Managed Care – PPO | Admitting: Dermatology

## 2023-07-31 VITALS — BP 127/79 | HR 86

## 2023-07-31 DIAGNOSIS — D229 Melanocytic nevi, unspecified: Secondary | ICD-10-CM

## 2023-07-31 DIAGNOSIS — L578 Other skin changes due to chronic exposure to nonionizing radiation: Secondary | ICD-10-CM

## 2023-07-31 DIAGNOSIS — Z808 Family history of malignant neoplasm of other organs or systems: Secondary | ICD-10-CM

## 2023-07-31 DIAGNOSIS — L814 Other melanin hyperpigmentation: Secondary | ICD-10-CM

## 2023-07-31 DIAGNOSIS — Z840 Family history of diseases of the skin and subcutaneous tissue: Secondary | ICD-10-CM

## 2023-07-31 DIAGNOSIS — Z1283 Encounter for screening for malignant neoplasm of skin: Secondary | ICD-10-CM

## 2023-07-31 DIAGNOSIS — Z8589 Personal history of malignant neoplasm of other organs and systems: Secondary | ICD-10-CM

## 2023-07-31 DIAGNOSIS — Z85828 Personal history of other malignant neoplasm of skin: Secondary | ICD-10-CM

## 2023-07-31 NOTE — Patient Instructions (Signed)

## 2023-07-31 NOTE — Progress Notes (Signed)
   Follow-Up Visit   Subjective  Jane Wells is a 52 y.o. female who presents for the following: Skin Cancer Screening and Full Body Skin Exam hx of bcc, hx of isk, Hx of mole on left elbow  Mole at left side of nose  The patient presents for Total-Body Skin Exam (TBSE) for skin cancer screening and mole check. The patient has spots, moles and lesions to be evaluated, some may be new or changing and the patient may have concern these could be cancer.    The following portions of the chart were reviewed this encounter and updated as appropriate: medications, allergies, medical history  Review of Systems:  No other skin or systemic complaints except as noted in HPI or Assessment and Plan.  Objective  Well appearing patient in no apparent distress; mood and affect are within normal limits.  A full examination was performed including scalp, head, eyes, ears, nose, lips, neck, chest, axillae, abdomen, back, buttocks, bilateral upper extremities, bilateral lower extremities, hands, feet, fingers, toes, fingernails, and toenails. All findings within normal limits unless otherwise noted below.   Relevant physical exam findings are noted in the Assessment and Plan.         Assessment & Plan   SKIN CANCER SCREENING PERFORMED TODAY.  ACTINIC DAMAGE - Chronic condition, secondary to cumulative UV/sun exposure - diffuse scaly erythematous macules with underlying dyspigmentation - Recommend daily broad spectrum sunscreen SPF 30+ to sun-exposed areas, reapply every 2 hours as needed.  - Staying in the shade or wearing long sleeves, sun glasses (UVA+UVB protection) and wide brim hats (4-inch brim around the entire circumference of the hat) are also recommended for sun protection.  - Call for new or changing lesions.  LENTIGINES, SEBORRHEIC KERATOSES, HEMANGIOMAS - Benign normal skin lesions - Benign-appearing - Call for any changes  MELANOCYTIC NEVI - Tan-brown and/or  pink-flesh-colored symmetric macules and papules - Benign appearing on exam today - Observation - Call clinic for new or changing moles - Recommend daily use of broad spectrum spf 30+ sunscreen to sun-exposed areas.   Nevus  0.4 cm light brown regular macule at left 3rd toe See above photos  Benign-appearing.  Observation.  Call clinic for new or changing lesions.  Recommend daily use of broad spectrum spf 30+ sunscreen to sun-exposed areas.    HISTORY OF BASAL CELL CARCINOMA OF THE SKIN  Left calf 2021 - No evidence of recurrence today - Recommend regular full body skin exams - Recommend daily broad spectrum sunscreen SPF 30+ to sun-exposed areas, reapply every 2 hours as needed.  - Call if any new or changing lesions are noted between office visits  HISTORY OF SQUAMOUS CELL CARCINOMA in situ OF THE SKIN Left distal shin 2021 - No evidence of recurrence today - No lymphadenopathy - Recommend regular full body skin exams - Recommend daily broad spectrum sunscreen SPF 30+ to sun-exposed areas, reapply every 2 hours as needed.  - Call if any new or changing lesions are noted between office visits  FAMILY HISTORY OF SKIN CANCER What type(s):melanoma  Who affected:father, paternal grandfather,  and brother    Return in about 1 year (around 07/30/2024) for TBSE.  IAsher Muir, CMA, am acting as scribe for Armida Sans, MD.   Documentation: I have reviewed the above documentation for accuracy and completeness, and I agree with the above.  Armida Sans, MD

## 2023-08-02 ENCOUNTER — Encounter: Payer: Self-pay | Admitting: Dermatology

## 2023-11-18 ENCOUNTER — Encounter: Payer: Self-pay | Admitting: Dermatology

## 2023-11-18 ENCOUNTER — Ambulatory Visit: Payer: BC Managed Care – PPO | Admitting: Dermatology

## 2023-11-18 DIAGNOSIS — Z808 Family history of malignant neoplasm of other organs or systems: Secondary | ICD-10-CM | POA: Diagnosis not present

## 2023-11-18 DIAGNOSIS — D0462 Carcinoma in situ of skin of left upper limb, including shoulder: Secondary | ICD-10-CM | POA: Diagnosis not present

## 2023-11-18 DIAGNOSIS — D492 Neoplasm of unspecified behavior of bone, soft tissue, and skin: Secondary | ICD-10-CM

## 2023-11-18 DIAGNOSIS — C4492 Squamous cell carcinoma of skin, unspecified: Secondary | ICD-10-CM

## 2023-11-18 HISTORY — DX: Squamous cell carcinoma of skin, unspecified: C44.92

## 2023-11-18 NOTE — Patient Instructions (Signed)
Wound Care Instructions  Cleanse wound gently with soap and water once a day then pat dry with clean gauze. Apply a thin coat of Petrolatum (petroleum jelly, "Vaseline") over the wound (unless you have an allergy to this). We recommend that you use a new, sterile tube of Vaseline. Do not pick or remove scabs. Do not remove the yellow or white "healing tissue" from the base of the wound.  Cover the wound with fresh, clean, nonstick gauze and secure with paper tape. You may use Band-Aids in place of gauze and tape if the wound is small enough, but would recommend trimming much of the tape off as there is often too much. Sometimes Band-Aids can irritate the skin.  You should call the office for your biopsy report after 1 week if you have not already been contacted.  If you experience any problems, such as abnormal amounts of bleeding, swelling, significant bruising, significant pain, or evidence of infection, please call the office immediately.  FOR ADULT SURGERY PATIENTS: If you need something for pain relief you may take 1 extra strength Tylenol (acetaminophen) AND 2 Ibuprofen (200mg  each) together every 4 hours as needed for pain. (do not take these if you are allergic to them or if you have a reason you should not take them.) Typically, you may only need pain medication for 1 to 3 days.    Recommend daily broad spectrum sunscreen SPF 30+ to sun-exposed areas, reapply every 2 hours as needed. Call for new or changing lesions.  Staying in the shade or wearing long sleeves, sun glasses (UVA+UVB protection) and wide brim hats (4-inch brim around the entire circumference of the hat) are also recommended for sun protection.    Due to recent changes in healthcare laws, you may see results of your pathology and/or laboratory studies on MyChart before the doctors have had a chance to review them. We understand that in some cases there may be results that are confusing or concerning to you. Please understand  that not all results are received at the same time and often the doctors may need to interpret multiple results in order to provide you with the best plan of care or course of treatment. Therefore, we ask that you please give [place] 2 business days to thoroughly review all your results before contacting the office for clarification. Should we see a critical lab result, you will be contacted sooner.   If You Need Anything After Your Visit  If you have any questions or concerns for your doctor, please call our main line at [phone] and press option 4 to reach your doctor's medical assistant. If no one answers, please leave a voicemail as directed and we will return your call as soon as possible. Messages left after 4 pm will be answered the following business day.   You may also send [place] a message via MyChart. We typically respond to MyChart messages within 1-2 business days.  For prescription refills, please ask your pharmacy to contact our office. Our fax number is [phone].  If you have an urgent issue when the clinic is closed that cannot wait until the next business day, you can page your doctor at the number below.    Please note that while we do our best to be available for urgent issues outside of office hours, we are not available 24/7.   If you have an urgent issue and are unable to reach [place], you may choose to seek medical care at your doctor's office,  retail clinic, urgent care center, or emergency room.  If you have a medical emergency, please immediately call 911 or go to the emergency department.  Pager Numbers  - Dr. Gwen Pounds: [phone]  - Dr. Roseanne Reno: [phone]  - Dr. Katrinka Blazing: [phone]   In the event of inclement weather, please call our main line at [phone] for an update on the status of any delays or closures.  Dermatology Medication Tips: Please keep the boxes that topical medications come in in order to help keep track of the instructions about where  and how to use these. Pharmacies typically print the medication instructions only on the boxes and not directly on the medication tubes.   If your medication is too expensive, please contact our office at [phone] option 4 or send [place] a message through MyChart.   We are unable to tell what your co-pay for medications will be in advance as this is different depending on your insurance coverage. However, we may be able to find a substitute medication at lower cost or fill out paperwork to get insurance to cover a needed medication.   If a prior authorization is required to get your medication covered by your insurance company, please allow [place] 1-2 business days to complete this process.  Drug prices often vary depending on where the prescription is filled and some pharmacies may offer cheaper prices.  The website www.goodrx.com contains coupons for medications through different pharmacies. The prices here do not account for what the cost may be with help from insurance (it may be cheaper with your insurance), but the website can give you the price if you did not use any insurance.  - You can print the associated coupon and take it with your prescription to the pharmacy.  - You may also stop by our office during regular business hours and pick up a GoodRx coupon card.  - If you need your prescription sent electronically to a different pharmacy, notify our office through [facility] or by phone at [phone] option 4.     Si Usted Necesita Algo Despus de Su Visita  Tambin puede enviarnos un mensaje a travs de Clinical cytogeneticist. Por lo general respondemos a los mensajes de MyChart en el transcurso de 1 a 2 das hbiles.  Para renovar recetas, por favor pida a su farmacia que se ponga en contacto con nuestra oficina. Annie Sable de fax es [place] [phone].  Si tiene un asunto urgente cuando la clnica est cerrada y que no puede esperar hasta el siguiente da hbil, puede llamar/localizar a  su doctor(a) al nmero que aparece a continuacin.   Por favor, tenga en cuenta que aunque hacemos todo lo posible para estar disponibles para asuntos urgentes fuera del horario de [place], no estamos disponibles las 24 horas del da, los 7 [address]  [address] de la [place].   Si tiene un problema urgente y no puede comunicarse con nosotros, puede optar por buscar atencin mdica  en el consultorio de su doctor(a), en una clnica privada, en un centro de atencin urgente o en una sala de emergencias.  Si tiene Engineer, drilling, por favor llame inmediatamente al 911 o vaya a la sala de emergencias.  Nmeros de bper  - Dr. Gwen Pounds: [phone]  - Dra. Roseanne Reno: [phone]  - Dr. Katrinka Blazing: [phone]   En caso de inclemencias del tiempo, por favor llame a Lacy Duverney principal al [phone] para una actualizacin sobre el [place] de cualquier retraso o cierre.  Consejos para la medicacin en dermatologa: Por  favor, guarde las cajas en las que vienen los medicamentos de uso tpico para ayudarle a seguir las instrucciones sobre dnde y cmo usarlos. Las farmacias generalmente imprimen las instrucciones del medicamento slo en las cajas y no directamente en los tubos del [place].   Si su medicamento es muy caro, por favor, pngase en contacto con Rolm Gala llamando al [phone] y presione la opcin 4 o envenos un mensaje a travs de Clinical cytogeneticist.   No podemos decirle cul ser su copago por los medicamentos por adelantado ya que esto es diferente dependiendo de la cobertura de su seguro. Sin embargo, es posible que podamos encontrar un medicamento sustituto a Audiological scientist un formulario para que el seguro cubra el medicamento que se considera necesario.   Si se requiere una autorizacin previa para que su compaa de seguros [place] su medicamento, por favor permtanos de 1 a 2 das hbiles para completar [address]  Los precios de los medicamentos varan con frecuencia dependiendo  del Environmental consultant de dnde se surte la receta y alguna farmacias pueden ofrecer precios ms baratos.  El sitio web www.goodrx.com tiene cupones para medicamentos de Health and safety inspector. Los precios aqu no tienen en cuenta lo que podra costar con la ayuda del seguro (puede ser ms barato con su seguro), pero el sitio web puede darle el precio si no utiliz Tourist information centre manager.  - Puede imprimir el cupn correspondiente y llevarlo con su receta a la farmacia.  - Tambin puede pasar por nuestra oficina durante el horario de atencin regular y Education officer, museum una tarjeta de cupones de GoodRx.  - Si necesita que su receta se enve electrnicamente a una farmacia diferente, informe a nuestra oficina a travs de MyChart de [place] o por telfono llamando al [phone] y presione la opcin 4.

## 2023-11-18 NOTE — Progress Notes (Signed)
   Follow-Up Visit   Subjective  Jane Wells is a 53 y.o. female who presents for the following: Spot on left hand. Noticed a small spot ~1-2 weeks after last TBSE 07/31/2023. Has gotten larger. Used Mupirocin, did not help. Non tender, denies bleeding, denies stinging. Does itch at times.   Grandfather had MM, COD.   The patient has spots, moles and lesions to be evaluated, some may be new or changing and the patient may have concern these could be cancer.    The following portions of the chart were reviewed this encounter and updated as appropriate: medications, allergies, medical history  Review of Systems:  No other skin or systemic complaints except as noted in HPI or Assessment and Plan.  Objective  Well appearing patient in no apparent distress; mood and affect are within normal limits.  A focused examination was performed of the following areas: Left dorsal hand  Relevant physical exam findings are noted in the Assessment and Plan.      Left Dorsal Hand 12 mm well demarcated pink plaque   Assessment & Plan   NEOPLASM OF SKIN Left Dorsal Hand Skin / nail biopsy Type of biopsy: tangential   Informed consent: discussed and consent obtained   Timeout: patient name, date of birth, surgical site, and procedure verified   Procedure prep:  Patient was prepped and draped in usual sterile fashion Prep type:  Isopropyl alcohol Anesthesia: the lesion was anesthetized in a standard fashion   Anesthetic:  1% lidocaine w/ epinephrine 1-100,000 buffered w/ 8.4% NaHCO3 Instrument used: DermaBlade   Hemostasis achieved with: pressure and aluminum chloride   Outcome: patient tolerated procedure well   Post-procedure details: sterile dressing applied and wound care instructions given   Dressing type: bandage and petrolatum   Specimen 1 - Surgical pathology Differential Diagnosis: ISK vs SCC vs Eczema  Check Margins: No  FAMILY HISTORY OF SKIN CANCER What type(s):  Melanoma Who affected: Grandfather   Return for TBSE As Scheduled, With Dr. Gwen Pounds.  I, Lawson Radar, CMA, am acting as scribe for Elie Goody, MD.   Documentation: I have reviewed the above documentation for accuracy and completeness, and I agree with the above.  Elie Goody, MD

## 2023-11-20 LAB — SURGICAL PATHOLOGY

## 2023-11-21 ENCOUNTER — Telehealth: Payer: Self-pay

## 2023-11-21 DIAGNOSIS — D099 Carcinoma in situ, unspecified: Secondary | ICD-10-CM

## 2023-11-21 NOTE — Telephone Encounter (Signed)
Advised pt of bx results, discussed treatment options.  Pt would prefer to have mohs in [place].  Advised pt I would send referral to [facility] and they would contact her with appointment.  Advised pt to call us if she has not heard from [facility] by 2 weeks.

## 2023-11-21 NOTE — Telephone Encounter (Signed)
-----   Message from [place] sent at 11/21/2023  9:53 AM EST ----- Diagnosis left dorsal hand :       SQUAMOUS CELL CARCINOMA IN SITU     Please call with diagnosis and message me with patient's decision on treatment.   Explanation: This is a squamous cell skin cancer limited to the top layer of skin. This means it is an early cancer and has not spread. However, it has the potential to spread beyond the skin and threaten your health, so we recommend treating it.   Treatment option 1: a cream (fluorouracil and calcipotriene) that helps your immune system clear the skin cancer. It will cause redness and irritation. Wait two weeks after the biopsy to start applying the cream. Apply the cream twice per day until the redness and irritation develop (usually occurs by day 7), then stop and allow it to heal. We will recheck the area in 2 months to ensure the cancer is gone. The cream is $35 plus shipping and will be mailed to you from a low cost compounding pharmacy.  Treatment option 2: you return for a brief appointment where I perform electrodesiccation and curettage [facility]). This involves three rounds of scraping and burning to destroy the skin cancer. It has about an 85% cure rate and leaves a round wound slightly larger than the skin cancer and leaves a round white scar. No additional pathology is done. If the skin cancer comes back, we would need to do a surgery to remove it.  Treatment option 3: Mohs surgery, which involves cutting out right around the skin cancer and then checking under the microscope on the same day to ensure the whole skin cancer is out. If there is more cancer remaining, the surgeon will repeat the process until it is fully cleared. The cure rate is about 98-99%. It is done at another office outside of [place] ([place], [place], or [place]). Once the Mohs surgeon confirms the skin cancer is out, they will discuss the options to repair or heal the area. You must  take it easy for about two weeks after surgery (no lifting over 10-15 lbs, avoid activity to get your heart rate and blood pressure up).

## 2023-12-23 ENCOUNTER — Telehealth: Payer: Self-pay

## 2023-12-23 NOTE — Telephone Encounter (Signed)
Updated specimen tracking and history from [facility] 12/06/2023 of left dorsal hand.

## 2024-02-01 NOTE — Progress Notes (Unsigned)
 PCP: Patient, No Pcp Per   No chief complaint on file.   HPI:      [name] is a 53 y.o. No obstetric history on file. who LMP was Patient's last menstrual period was 01/09/2022 (approximate)., presents today for her annual examination.  Her menses are absent since stopping OCPs last yr. No VB/spotting. Has tolerable vasomotor sx now; were worse initially. Has noticed wt gain even with significant exercise/good diet; increased constipation, trouble sleeping. Tried trazodone last yr which made her more awake (same thing happens with melatonin). Taking ibup PM occas with good sleep.   Sex activity: single partner, contraception - vasectomy. No pain/bleeding. Has some dryness now, improved with lubricants.   Last Pap: 01/29/23 Results were: no abnormalities /neg HPV DNA. No hx of abn paps.  Last mammogram: 03/14/23 at [facility]; Results were: normal; hx of stable RT breast with dx mammo and u/s in past. Has appt 4/24.**** There is no FH of breast cancer. There is no FH of ovarian cancer. The patient does do self-breast exams.  Colonoscopy: never, NEG cologuard 3/24 and 1/21; repeat due after 3 yrs.  Tobacco use: The patient denies current or previous tobacco use. Alcohol use: glass of wine with dinner a few nights wkly No drug use Exercise: very active  She does get adequate calcium and Vitamin D in her diet.  Had borderline lipids/TGs 2/23, due for lab recheck today.   Past Medical History:  Diagnosis Date   Basal cell carcinoma 12/24/2019   Left calf. Superficial.    Crohn disease (HCC)    16 yr   Fibroid    SCC (squamous cell carcinoma) in situ 11/18/2023   L dorsal hand, SQUAMOUS CELL CARCINOMA IN SITU, MOHs 12/06/2023   Screening for colon cancer 12/2019   Cologuard neg; repeat after 3 yrs   Squamous cell carcinoma of skin 12/24/2019   Left distal shin. Pigmented. SCCIs    Past Surgical History:  Procedure Laterality Date   BLADDER SURGERY      Family History   Problem Relation Age of Onset   Hypertension Mother    Diabetes Mother    Cervical cancer Mother        8s   Multiple sclerosis Father    Hypertension Father    Lung cancer Father    Cervical cancer Maternal Aunt        63s   Cervical cancer Maternal Aunt        25s   Cervical cancer Maternal Aunt        73s   Cervical cancer Paternal Grandmother    Breast cancer Other        unsure of age   Ovarian cancer Neg Hx     Social History   Socioeconomic History   Marital status: Married    Spouse name: Not on file   Number of children: Not on file   Years of education: Not on file   Highest education level: Not on file  Occupational History   Not on file  Tobacco Use   Smoking status: Never   Smokeless tobacco: Never  Vaping Use   Vaping status: Never Used  Substance and Sexual Activity   Alcohol use: Yes    Comment: wine 1-2 glasse a night   Drug use: No   Sexual activity: Yes    Birth control/protection: Surgical    Comment: Vasectomy  Other Topics Concern   Not on file  Social History Narrative  Not on file   Social Drivers of Health   Financial Resource Strain: Not on file  Food Insecurity: Not on file  Transportation Needs: Not on file  Physical Activity: Not on file  Stress: Not on file  Social Connections: Not on file  Intimate Partner Violence: Not on file     Current Outpatient Medications:    cetirizine (ZYRTEC) 10 MG chewable tablet, Chew by mouth., Disp: , Rfl:    cholecalciferol (VITAMIN D) 1000 UNITS tablet, Take 1,000 Units by mouth daily., Disp: , Rfl:    fluticasone (FLONASE) 50 MCG/ACT nasal spray, Place into the nose., Disp: , Rfl:    vitamin B-12 (CYANOCOBALAMIN) 1000 MCG tablet, Take 1,000 mcg by mouth daily., Disp: , Rfl:      ROS:  Review of Systems  Constitutional:  Negative for fatigue, fever and unexpected weight change.  Respiratory:  Negative for cough, shortness of breath and wheezing.   Cardiovascular:  Negative for  chest pain, palpitations and leg swelling.  Gastrointestinal:  Positive for constipation. Negative for blood in stool, diarrhea, nausea and vomiting.  Endocrine: Negative for cold intolerance, heat intolerance and polyuria.  Genitourinary:  Negative for dyspareunia, dysuria, flank pain, frequency, genital sores, hematuria, menstrual problem, pelvic pain, urgency, vaginal bleeding, vaginal discharge and vaginal pain.  Musculoskeletal:  Negative for back pain, joint swelling and myalgias.  Skin:  Negative for rash.  Neurological:  Negative for dizziness, syncope, light-headedness, numbness and headaches.  Hematological:  Negative for adenopathy.  Psychiatric/Behavioral:  Negative for agitation, confusion, sleep disturbance and suicidal ideas. The patient is not nervous/anxious.    BREAST: No symptoms    Objective: LMP 01/09/2022 (Approximate)    Physical Exam Constitutional:      Appearance: She is well-developed.  Genitourinary:     Vulva normal.     Right Labia: No rash, tenderness or lesions.    Left Labia: No tenderness, lesions or rash.    No vaginal discharge, erythema or tenderness.      Right Adnexa: not tender and no mass present.    Left Adnexa: not tender and no mass present.    No cervical friability or polyp.     Uterus is not enlarged or tender.  Breasts:    Right: No mass, nipple discharge, skin change or tenderness.     Left: No mass, nipple discharge, skin change or tenderness.  Neck:     Thyroid: No thyromegaly.  Cardiovascular:     Rate and Rhythm: Normal rate and regular rhythm.     Heart sounds: Normal heart sounds. No murmur heard. Pulmonary:     Effort: Pulmonary effort is normal.     Breath sounds: Normal breath sounds.  Abdominal:     Palpations: Abdomen is soft.     Tenderness: There is no abdominal tenderness. There is no guarding or rebound.  Musculoskeletal:        General: Normal range of motion.     Cervical back: Normal range of motion.   Lymphadenopathy:     Cervical: No cervical adenopathy.  Neurological:     General: No focal deficit present.     Mental Status: She is alert and oriented to person, place, and time.     Cranial Nerves: No cranial nerve deficit.  Skin:    General: Skin is warm and dry.  Psychiatric:        Mood and Affect: Mood normal.        Behavior: Behavior normal.  Thought Content: Thought content normal.        Judgment: Judgment normal.  Vitals reviewed.     Assessment/Plan: Encounter for annual routine gynecological examination  Cervical cancer screening - Plan: Cytology - PAP  Screening for HPV (human papillomavirus) - Plan: Cytology - PAP  Encounter for screening mammogram for malignant neoplasm of breast - Plan: MM 3D SCREEN BREAST BILATERAL; pt has appt  Screening for colon cancer - Plan: Cologuard; pt wants cologuard again.   Menopausal symptoms--tolerable VS sx, add pre/probiotics for constipation, exercise changes for wt gain.   Screening cholesterol level - Plan: Lipid panel, Lipid panel  Elevated LDL cholesterol level - Plan: Lipid panel, Lipid panel  Blood tests for routine general physical examination - Plan: Lipid panel, Comprehensive metabolic panel, Lipid panel, Comprehensive metabolic panel          GYN counsel breast self exam, mammography screening, menopause, adequate intake of calcium and vitamin D, diet and exercise    F/U  No follow-ups on file.  Jermayne Sweeney B. Madsen Riddle, PA-C 02/01/2024 12:44 PM

## 2024-02-03 ENCOUNTER — Encounter: Payer: Self-pay | Admitting: Obstetrics and Gynecology

## 2024-02-03 ENCOUNTER — Ambulatory Visit (INDEPENDENT_AMBULATORY_CARE_PROVIDER_SITE_OTHER): Payer: BC Managed Care – PPO | Admitting: Obstetrics and Gynecology

## 2024-02-03 VITALS — BP 137/79 | HR 78 | Ht 65.5 in | Wt 148.0 lb

## 2024-02-03 DIAGNOSIS — E7800 Pure hypercholesterolemia, unspecified: Secondary | ICD-10-CM

## 2024-02-03 DIAGNOSIS — Z0000 Encounter for general adult medical examination without abnormal findings: Secondary | ICD-10-CM

## 2024-02-03 DIAGNOSIS — N951 Menopausal and female climacteric states: Secondary | ICD-10-CM

## 2024-02-03 DIAGNOSIS — Z01419 Encounter for gynecological examination (general) (routine) without abnormal findings: Secondary | ICD-10-CM | POA: Diagnosis not present

## 2024-02-03 DIAGNOSIS — E559 Vitamin D deficiency, unspecified: Secondary | ICD-10-CM

## 2024-02-03 DIAGNOSIS — Z1231 Encounter for screening mammogram for malignant neoplasm of breast: Secondary | ICD-10-CM

## 2024-02-03 DIAGNOSIS — E7801 Familial hypercholesterolemia: Secondary | ICD-10-CM

## 2024-02-03 NOTE — Patient Instructions (Signed)
 I value your feedback and you entrusting [place] with your care. If you get a [place] patient survey, I would appreciate you taking the time to let us know about your experience today. Thank you! ? ? ?

## 2024-02-04 ENCOUNTER — Encounter: Payer: Self-pay | Admitting: Obstetrics and Gynecology

## 2024-02-04 DIAGNOSIS — R899 Unspecified abnormal finding in specimens from other organs, systems and tissues: Secondary | ICD-10-CM

## 2024-02-04 LAB — COMPREHENSIVE METABOLIC PANEL
ALT: 16 IU/L (ref 0–32)
AST: 23 IU/L (ref 0–40)
Albumin: 4.6 g/dL (ref 3.8–4.9)
Alkaline Phosphatase: 124 IU/L — ABNORMAL HIGH (ref 44–121)
BUN/Creatinine Ratio: 24 — ABNORMAL HIGH (ref 9–23)
BUN: 21 mg/dL (ref 6–24)
Bilirubin Total: 0.4 mg/dL (ref 0.0–1.2)
CO2: 22 mmol/L (ref 20–29)
Calcium: 9.7 mg/dL (ref 8.7–10.2)
Chloride: 104 mmol/L (ref 96–106)
Creatinine, Ser: 0.89 mg/dL (ref 0.57–1.00)
Globulin, Total: 2.5 g/dL (ref 1.5–4.5)
Glucose: 87 mg/dL (ref 70–99)
Potassium: 4.2 mmol/L (ref 3.5–5.2)
Sodium: 142 mmol/L (ref 134–144)
Total Protein: 7.1 g/dL (ref 6.0–8.5)
eGFR: 77 mL/min/{1.73_m2} (ref >59)

## 2024-02-04 LAB — CBC WITH DIFFERENTIAL/PLATELET
Basophils Absolute: 0 10*3/uL (ref 0.0–0.2)
Basos: 1 %
EOS (ABSOLUTE): 0 10*3/uL (ref 0.0–0.4)
Eos: 1 %
Hematocrit: 43.8 % (ref 34.0–46.6)
Hemoglobin: 15 g/dL (ref 11.1–15.9)
Immature Grans (Abs): 0 10*3/uL (ref 0.0–0.1)
Immature Granulocytes: 0 %
Lymphocytes Absolute: 1.9 10*3/uL (ref 0.7–3.1)
Lymphs: 46 %
MCH: 32.1 pg (ref 26.6–33.0)
MCHC: 34.2 g/dL (ref 31.5–35.7)
MCV: 94 fL (ref 79–97)
Monocytes Absolute: 0.2 10*3/uL (ref 0.1–0.9)
Monocytes: 6 %
Neutrophils Absolute: 1.8 10*3/uL (ref 1.4–7.0)
Neutrophils: 46 %
Platelets: 224 10*3/uL (ref 150–450)
RBC: 4.68 x10E6/uL (ref 3.77–5.28)
RDW: 12.3 % (ref 11.7–15.4)
WBC: 4 10*3/uL (ref 3.4–10.8)

## 2024-02-04 LAB — LIPID PANEL
Chol/HDL Ratio: 3.9 ratio (ref 0.0–4.4)
Cholesterol, Total: 252 mg/dL — ABNORMAL HIGH (ref 100–199)
HDL: 64 mg/dL (ref >39)
LDL Chol Calc (NIH): 164 mg/dL — ABNORMAL HIGH (ref 0–99)
Triglycerides: 136 mg/dL (ref 0–149)
VLDL Cholesterol Cal: 24 mg/dL (ref 5–40)

## 2024-02-04 NOTE — Addendum Note (Signed)
 Addended by: Althea Grimmer B on: 02/04/2024 10:42 AM   Modules accepted: Orders

## 2024-02-04 NOTE — Telephone Encounter (Signed)
 F/u

## 2024-02-04 NOTE — Telephone Encounter (Signed)
 Can you ask Jane Wells if they can add Vit D to her recent labs? Thx.

## 2024-02-05 LAB — SPECIMEN STATUS REPORT

## 2024-02-05 LAB — VITAMIN D 25 HYDROXY (VIT D DEFICIENCY, FRACTURES): Vit D, 25-Hydroxy: 39.4 ng/mL (ref 30.0–100.0)

## 2024-03-16 ENCOUNTER — Encounter: Payer: Self-pay | Admitting: Obstetrics and Gynecology

## 2024-03-16 LAB — HM MAMMOGRAPHY

## 2024-04-06 ENCOUNTER — Encounter: Payer: Self-pay | Admitting: Obstetrics and Gynecology

## 2024-04-11 ENCOUNTER — Encounter: Payer: Self-pay | Admitting: Obstetrics and Gynecology

## 2024-04-13 MED ORDER — ESTRADIOL 0.1 MG/GM VA CREA: TOPICAL_CREAM | VAGINAL | 0 refills | Status: AC

## 2024-05-04 ENCOUNTER — Other Ambulatory Visit

## 2024-05-04 DIAGNOSIS — R899 Unspecified abnormal finding in specimens from other organs, systems and tissues: Secondary | ICD-10-CM

## 2024-05-05 ENCOUNTER — Ambulatory Visit: Payer: Self-pay | Admitting: Obstetrics and Gynecology

## 2024-05-05 LAB — COMPREHENSIVE METABOLIC PANEL WITH GFR
ALT: 22 IU/L (ref 0–32)
AST: 24 IU/L (ref 0–40)
Albumin: 4.6 g/dL (ref 3.8–4.9)
Alkaline Phosphatase: 105 IU/L (ref 44–121)
BUN/Creatinine Ratio: 23 (ref 9–23)
BUN: 20 mg/dL (ref 6–24)
Bilirubin Total: 0.4 mg/dL (ref 0.0–1.2)
CO2: 21 mmol/L (ref 20–29)
Calcium: 9.5 mg/dL (ref 8.7–10.2)
Chloride: 100 mmol/L (ref 96–106)
Creatinine, Ser: 0.87 mg/dL (ref 0.57–1.00)
Globulin, Total: 2.1 g/dL (ref 1.5–4.5)
Glucose: 98 mg/dL (ref 70–99)
Potassium: 4.1 mmol/L (ref 3.5–5.2)
Sodium: 139 mmol/L (ref 134–144)
Total Protein: 6.7 g/dL (ref 6.0–8.5)
eGFR: 79 mL/min/{1.73_m2} (ref >59)

## 2024-08-13 ENCOUNTER — Ambulatory Visit: Payer: BC Managed Care – PPO | Admitting: Dermatology

## 2024-08-18 ENCOUNTER — Ambulatory Visit (INDEPENDENT_AMBULATORY_CARE_PROVIDER_SITE_OTHER): Admitting: Dermatology

## 2024-08-18 ENCOUNTER — Encounter: Payer: Self-pay | Admitting: Dermatology

## 2024-08-18 DIAGNOSIS — Z808 Family history of malignant neoplasm of other organs or systems: Secondary | ICD-10-CM

## 2024-08-18 DIAGNOSIS — L821 Other seborrheic keratosis: Secondary | ICD-10-CM

## 2024-08-18 DIAGNOSIS — L814 Other melanin hyperpigmentation: Secondary | ICD-10-CM | POA: Diagnosis not present

## 2024-08-18 DIAGNOSIS — W908XXA Exposure to other nonionizing radiation, initial encounter: Secondary | ICD-10-CM

## 2024-08-18 DIAGNOSIS — D229 Melanocytic nevi, unspecified: Secondary | ICD-10-CM

## 2024-08-18 DIAGNOSIS — L578 Other skin changes due to chronic exposure to nonionizing radiation: Secondary | ICD-10-CM | POA: Diagnosis not present

## 2024-08-18 DIAGNOSIS — L820 Inflamed seborrheic keratosis: Secondary | ICD-10-CM

## 2024-08-18 DIAGNOSIS — Z1283 Encounter for screening for malignant neoplasm of skin: Secondary | ICD-10-CM

## 2024-08-18 DIAGNOSIS — Z8589 Personal history of malignant neoplasm of other organs and systems: Secondary | ICD-10-CM

## 2024-08-18 DIAGNOSIS — D2272 Melanocytic nevi of left lower limb, including hip: Secondary | ICD-10-CM

## 2024-08-18 DIAGNOSIS — Z85828 Personal history of other malignant neoplasm of skin: Secondary | ICD-10-CM

## 2024-08-18 NOTE — Progress Notes (Signed)
 Follow-Up Visit   Subjective  Jane Wells is a 53 y.o. female who presents for the following: Skin Cancer Screening and Full Body Skin Exam Hx of BCC, SCC IS check spot L cheek, ~18yr, no symptoms  The patient presents for Total-Body Skin Exam (TBSE) for skin cancer screening and mole check. The patient has spots, moles and lesions to be evaluated, some may be new or changing and the patient may have concern these could be cancer.  The following portions of the chart were reviewed this encounter and updated as appropriate: medications, allergies, medical history  Review of Systems:  No other skin or systemic complaints except as noted in HPI or Assessment and Plan.  Objective  Well appearing patient in no apparent distress; mood and affect are within normal limits.  A full examination was performed including scalp, head, eyes, ears, nose, lips, neck, chest, axillae, abdomen, back, buttocks, bilateral upper extremities, bilateral lower extremities, hands, feet, fingers, toes, fingernails, and toenails. All findings within normal limits unless otherwise noted below.   Relevant physical exam findings are noted in the Assessment and Plan.  back, L post thigh x 5, L cheek x 1 (6) Stuck on waxy paps with erythema  Assessment & Plan   SKIN CANCER SCREENING PERFORMED TODAY.  ACTINIC DAMAGE - Chronic condition, secondary to cumulative UV/sun exposure - diffuse scaly erythematous macules with underlying dyspigmentation - Recommend daily broad spectrum sunscreen SPF 30+ to sun-exposed areas, reapply every 2 hours as needed.  - Staying in the shade or wearing long sleeves, sun glasses (UVA+UVB protection) and wide brim hats (4-inch brim around the entire circumference of the hat) are also recommended for sun protection.  - Call for new or changing lesions.  LENTIGINES, SEBORRHEIC KERATOSES, HEMANGIOMAS - Benign normal skin lesions - Benign-appearing - Call for any changes  MELANOCYTIC  NEVI - Tan-brown and/or pink-flesh-colored symmetric macules and papules - Benign appearing on exam today - Observation - Call clinic for new or changing moles - Recommend daily use of broad spectrum spf 30+ sunscreen to sun-exposed areas.  - L 3rd toe 0.4cm light brown regular macule  HISTORY OF BASAL CELL CARCINOMA OF THE SKIN - No evidence of recurrence today - Recommend regular full body skin exams - Recommend daily broad spectrum sunscreen SPF 30+ to sun-exposed areas, reapply every 2 hours as needed.  - Call if any new or changing lesions are noted between office visits  - L calf  HISTORY OF SQUAMOUS CELL CARCINOMA IN SITU OF THE SKIN - No evidence of recurrence today - Recommend regular full body skin exams - Recommend daily broad spectrum sunscreen SPF 30+ to sun-exposed areas, reapply every 2 hours as needed.  - Call if any new or changing lesions are noted between office visits  -L dorsal hand, L distal shin  FAMILY HISTORY OF SKIN CANCER What type(s):melanoma Who affected: paternal grandfather, brother    INFLAMED SEBORRHEIC KERATOSIS (6) back, L post thigh x 5, L cheek x 1 (6) Symptomatic, irritating, patient would like treated. Discussed if ISK L cheek does not resolve with LN2 rtc to re-evaluate / retreat Destruction of lesion - back, L post thigh x 5, L cheek x 1 (6) Complexity: simple   Destruction method: cryotherapy   Informed consent: discussed and consent obtained   Timeout:  patient name, date of birth, surgical site, and procedure verified Lesion destroyed using liquid nitrogen: Yes   Region frozen until ice ball extended beyond lesion: Yes   Outcome:  patient tolerated procedure well with no complications   Post-procedure details: wound care instructions given    Return in about 1 year (around 08/18/2025) for TBSE, Hx of BCC, Hx of SCC IS.  I, Sonya Hupman, RMA, am acting as scribe for Alm Rhyme, MD .   Documentation: I have reviewed the above  documentation for accuracy and completeness, and I agree with the above.  Alm Rhyme, MD

## 2024-08-18 NOTE — Patient Instructions (Addendum)

## 2024-09-08 ENCOUNTER — Ambulatory Visit: Admitting: Dermatology

## 2025-02-11 ENCOUNTER — Ambulatory Visit: Admitting: Registered Nurse

## 2025-08-24 ENCOUNTER — Ambulatory Visit: Admitting: Dermatology
